# Patient Record
Sex: Male | Born: 1985 | Race: Black or African American | Hispanic: No | Marital: Single | Smoking: Never smoker
Health system: Southern US, Community
[De-identification: ages and names within clinical notes are randomized; demographics above are authoritative.]

## PROBLEM LIST (undated history)

## (undated) DIAGNOSIS — R1310 Dysphagia, unspecified: Secondary | ICD-10-CM

## (undated) DIAGNOSIS — J189 Pneumonia, unspecified organism: Secondary | ICD-10-CM

## (undated) DIAGNOSIS — A072 Cryptosporidiosis: Secondary | ICD-10-CM

## (undated) DIAGNOSIS — D649 Anemia, unspecified: Secondary | ICD-10-CM

## (undated) DIAGNOSIS — J869 Pyothorax without fistula: Secondary | ICD-10-CM

## (undated) DIAGNOSIS — M79661 Pain in right lower leg: Secondary | ICD-10-CM

## (undated) DIAGNOSIS — I3139 Other pericardial effusion (noninflammatory): Secondary | ICD-10-CM

## (undated) DIAGNOSIS — R627 Adult failure to thrive: Secondary | ICD-10-CM

## (undated) DIAGNOSIS — A539 Syphilis, unspecified: Secondary | ICD-10-CM

## (undated) DIAGNOSIS — I313 Pericardial effusion (noninflammatory): Secondary | ICD-10-CM

## (undated) DIAGNOSIS — K922 Gastrointestinal hemorrhage, unspecified: Secondary | ICD-10-CM

## (undated) DIAGNOSIS — Z9289 Personal history of other medical treatment: Secondary | ICD-10-CM

## (undated) DIAGNOSIS — K5190 Ulcerative colitis, unspecified, without complications: Secondary | ICD-10-CM

## (undated) DIAGNOSIS — B20 Human immunodeficiency virus [HIV] disease: Secondary | ICD-10-CM

## (undated) DIAGNOSIS — B9562 Methicillin resistant Staphylococcus aureus infection as the cause of diseases classified elsewhere: Secondary | ICD-10-CM

## (undated) DIAGNOSIS — R7881 Bacteremia: Secondary | ICD-10-CM

## (undated) DIAGNOSIS — Z21 Asymptomatic human immunodeficiency virus [HIV] infection status: Secondary | ICD-10-CM

## (undated) DIAGNOSIS — A0811 Acute gastroenteropathy due to Norwalk agent: Secondary | ICD-10-CM

## (undated) HISTORY — DX: Pain in right lower leg: M79.661

## (undated) HISTORY — DX: Adult failure to thrive: R62.7

## (undated) HISTORY — PX: THORACENTESIS: SHX235

## (undated) HISTORY — DX: Dysphagia, unspecified: R13.10

---
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
## 2008-04-23 DIAGNOSIS — Z9289 Personal history of other medical treatment: Secondary | ICD-10-CM

## 2008-04-23 HISTORY — DX: Personal history of other medical treatment: Z92.89

## 2008-04-23 HISTORY — PX: COLONOSCOPY: SHX174

## 2008-09-08 ENCOUNTER — Encounter: Payer: Self-pay | Admitting: Gastroenterology

## 2008-09-09 ENCOUNTER — Ambulatory Visit: Payer: Self-pay | Admitting: Internal Medicine

## 2008-09-09 DIAGNOSIS — K921 Melena: Secondary | ICD-10-CM

## 2008-09-09 DIAGNOSIS — R198 Other specified symptoms and signs involving the digestive system and abdomen: Secondary | ICD-10-CM

## 2008-09-09 DIAGNOSIS — R109 Unspecified abdominal pain: Secondary | ICD-10-CM

## 2008-09-16 ENCOUNTER — Encounter: Payer: Self-pay | Admitting: Internal Medicine

## 2008-09-16 ENCOUNTER — Ambulatory Visit: Payer: Self-pay | Admitting: Internal Medicine

## 2008-09-16 ENCOUNTER — Ambulatory Visit (HOSPITAL_COMMUNITY): Admission: RE | Admit: 2008-09-16 | Discharge: 2008-09-16 | Payer: Self-pay | Admitting: Internal Medicine

## 2008-09-17 ENCOUNTER — Encounter (INDEPENDENT_AMBULATORY_CARE_PROVIDER_SITE_OTHER): Payer: Self-pay

## 2008-11-18 ENCOUNTER — Emergency Department (HOSPITAL_COMMUNITY): Admission: EM | Admit: 2008-11-18 | Discharge: 2008-11-18 | Payer: Self-pay | Admitting: Emergency Medicine

## 2008-11-25 ENCOUNTER — Ambulatory Visit: Payer: Self-pay | Admitting: Internal Medicine

## 2008-11-25 DIAGNOSIS — K5190 Ulcerative colitis, unspecified, without complications: Secondary | ICD-10-CM | POA: Insufficient documentation

## 2008-11-28 ENCOUNTER — Inpatient Hospital Stay (HOSPITAL_COMMUNITY): Admission: EM | Admit: 2008-11-28 | Discharge: 2008-12-10 | Payer: Self-pay | Admitting: Emergency Medicine

## 2008-11-29 ENCOUNTER — Ambulatory Visit: Payer: Self-pay | Admitting: Pulmonary Disease

## 2008-11-29 ENCOUNTER — Ambulatory Visit: Payer: Self-pay | Admitting: Thoracic Surgery

## 2008-11-30 ENCOUNTER — Encounter (INDEPENDENT_AMBULATORY_CARE_PROVIDER_SITE_OTHER): Payer: Self-pay | Admitting: Internal Medicine

## 2008-12-01 ENCOUNTER — Ambulatory Visit: Payer: Self-pay | Admitting: Gastroenterology

## 2008-12-02 ENCOUNTER — Ambulatory Visit: Payer: Self-pay | Admitting: Internal Medicine

## 2008-12-02 ENCOUNTER — Encounter: Payer: Self-pay | Admitting: Thoracic Surgery

## 2008-12-10 ENCOUNTER — Encounter: Payer: Self-pay | Admitting: Internal Medicine

## 2008-12-21 ENCOUNTER — Encounter: Admission: RE | Admit: 2008-12-21 | Discharge: 2008-12-21 | Payer: Self-pay | Admitting: Thoracic Surgery

## 2008-12-21 ENCOUNTER — Ambulatory Visit: Payer: Self-pay | Admitting: Thoracic Surgery

## 2009-01-12 ENCOUNTER — Ambulatory Visit: Payer: Self-pay | Admitting: Thoracic Surgery

## 2009-01-12 ENCOUNTER — Encounter: Admission: RE | Admit: 2009-01-12 | Discharge: 2009-01-12 | Payer: Self-pay | Admitting: Thoracic Surgery

## 2009-03-09 ENCOUNTER — Ambulatory Visit: Payer: Self-pay | Admitting: Thoracic Surgery

## 2009-03-09 ENCOUNTER — Encounter: Admission: RE | Admit: 2009-03-09 | Discharge: 2009-03-09 | Payer: Self-pay | Admitting: Thoracic Surgery

## 2009-05-16 ENCOUNTER — Encounter (INDEPENDENT_AMBULATORY_CARE_PROVIDER_SITE_OTHER): Payer: Self-pay | Admitting: *Deleted

## 2009-06-08 ENCOUNTER — Ambulatory Visit: Payer: Self-pay | Admitting: Thoracic Surgery

## 2009-06-08 ENCOUNTER — Encounter: Admission: RE | Admit: 2009-06-08 | Discharge: 2009-06-08 | Payer: Self-pay | Admitting: Thoracic Surgery

## 2010-04-23 DIAGNOSIS — J189 Pneumonia, unspecified organism: Secondary | ICD-10-CM

## 2010-04-23 HISTORY — DX: Pneumonia, unspecified organism: J18.9

## 2010-05-15 ENCOUNTER — Encounter: Payer: Self-pay | Admitting: Thoracic Surgery

## 2010-05-24 NOTE — Letter (Signed)
Summary: Appointment Reminder  [facility]  [address]   [place], [place] [address]   Phone: [phone]  Fax: [phone]       May 16, 2009   JAMIL ARMWOOD [address] [place], [place]  [address] 03-Jun-1985    Dear Mr. Meiners,  We have been unable to reach you by phone to schedule a follow up   appointment that was recommended for you by Dr. Jena Gauss. It is very   important that we reach you to schedule an appointment. We hope that you  allow [place] to participate in your health care needs. Please contact us at  [phone] at your earliest convenience to schedule your appointment.  Sincerely,    [facility] R. Roetta Sessions, M.D.    Kassie Mends, M.D. Lorenza Burton, FNP-BC    Tana Coast, PA-C Phone: [phone]    Fax: [phone]

## 2010-07-29 LAB — BASIC METABOLIC PANEL
BUN: 2 mg/dL — ABNORMAL LOW (ref 6–23)
BUN: 3 mg/dL — ABNORMAL LOW (ref 6–23)
BUN: 3 mg/dL — ABNORMAL LOW (ref 6–23)
BUN: 4 mg/dL — ABNORMAL LOW (ref 6–23)
BUN: 7 mg/dL (ref 6–23)
CO2: 29 mEq/L (ref 19–32)
Calcium: 8.3 mg/dL — ABNORMAL LOW (ref 8.4–10.5)
Calcium: 8.5 mg/dL (ref 8.4–10.5)
Chloride: 102 mEq/L (ref 96–112)
Chloride: 102 mEq/L (ref 96–112)
Chloride: 94 mEq/L — ABNORMAL LOW (ref 96–112)
Creatinine, Ser: 0.5 mg/dL (ref 0.4–1.5)
Creatinine, Ser: 0.53 mg/dL (ref 0.4–1.5)
Creatinine, Ser: 0.58 mg/dL (ref 0.4–1.5)
GFR calc Af Amer: >60 mL/min (ref >60)
GFR calc Af Amer: >60 mL/min (ref >60)
GFR calc Af Amer: >60 mL/min (ref >60)
GFR calc non Af Amer: >60 mL/min (ref >60)
GFR calc non Af Amer: >60 mL/min (ref >60)
GFR calc non Af Amer: >60 mL/min (ref >60)
GFR calc non Af Amer: >60 mL/min (ref >60)
Glucose, Bld: 131 mg/dL — ABNORMAL HIGH (ref 70–99)
Glucose, Bld: 92 mg/dL (ref 70–99)
Glucose, Bld: 96 mg/dL (ref 70–99)
Glucose, Bld: 90 mg/dL (ref 70–99)
Potassium: 4.3 mEq/L (ref 3.5–5.1)
Potassium: 3.2 mEq/L — ABNORMAL LOW (ref 3.5–5.1)
Sodium: 131 mEq/L — ABNORMAL LOW (ref 135–145)

## 2010-07-29 LAB — CBC
HCT: 27.2 % — ABNORMAL LOW (ref 39.0–52.0)
HCT: 27.6 % — ABNORMAL LOW (ref 39.0–52.0)
HCT: 28.1 % — ABNORMAL LOW (ref 39.0–52.0)
HCT: 22.8 % — ABNORMAL LOW (ref 39.0–52.0)
Hemoglobin: 9.2 g/dL — ABNORMAL LOW (ref 13.0–17.0)
Hemoglobin: 7.6 g/dL — CL (ref 13.0–17.0)
MCHC: 33.9 g/dL (ref 30.0–36.0)
MCHC: 34.3 g/dL (ref 30.0–36.0)
MCHC: 33.3 g/dL (ref 30.0–36.0)
MCV: 83.6 fL (ref 78.0–100.0)
MCV: 89.2 fL (ref 78.0–100.0)
Platelets: 434 10*3/uL — ABNORMAL HIGH (ref 150–400)
Platelets: 468 10*3/uL — ABNORMAL HIGH (ref 150–400)
RBC: 3.21 MIL/uL — ABNORMAL LOW (ref 4.22–5.81)
RBC: 3.25 MIL/uL — ABNORMAL LOW (ref 4.22–5.81)
RDW: 17.9 % — ABNORMAL HIGH (ref 11.5–15.5)
RDW: 19.1 % — ABNORMAL HIGH (ref 11.5–15.5)
WBC: 5.2 10*3/uL (ref 4.0–10.5)
WBC: 5.3 10*3/uL (ref 4.0–10.5)

## 2010-07-29 LAB — ABO/RH: ABO/RH(D): B POS

## 2010-07-29 LAB — BLOOD GAS, ARTERIAL
Acid-Base Excess: 2.1 mmol/L — ABNORMAL HIGH (ref 0.0–2.0)
O2 Content: 2 L/min
pCO2 arterial: 30.7 mmHg — ABNORMAL LOW (ref 35.0–45.0)
pH, Arterial: 7.52 — ABNORMAL HIGH (ref 7.350–7.450)
pO2, Arterial: 82 mmHg (ref 80.0–100.0)

## 2010-07-29 LAB — CULTURE, BLOOD (ROUTINE X 2)

## 2010-07-29 LAB — CROSSMATCH
ABO/RH(D): B POS
Antibody Screen: NEGATIVE

## 2010-07-29 LAB — DIFFERENTIAL
Lymphs Abs: 1.1 10*3/uL (ref 0.7–4.0)
Metamyelocytes Relative: 0 %
Neutrophils Relative %: 92 % — ABNORMAL HIGH (ref 43–77)
Promyelocytes Absolute: 0 %
nRBC: 0 /100 WBC

## 2010-07-29 LAB — MAGNESIUM: Magnesium: 1.8 mg/dL (ref 1.5–2.5)

## 2010-07-30 LAB — CROSSMATCH

## 2010-07-30 LAB — BASIC METABOLIC PANEL
BUN: 2 mg/dL — ABNORMAL LOW (ref 6–23)
BUN: 3 mg/dL — ABNORMAL LOW (ref 6–23)
BUN: 4 mg/dL — ABNORMAL LOW (ref 6–23)
BUN: 4 mg/dL — ABNORMAL LOW (ref 6–23)
BUN: 5 mg/dL — ABNORMAL LOW (ref 6–23)
CO2: 25 mEq/L (ref 19–32)
CO2: 27 mEq/L (ref 19–32)
CO2: 29 mEq/L (ref 19–32)
Calcium: 7.6 mg/dL — ABNORMAL LOW (ref 8.4–10.5)
Calcium: 7.7 mg/dL — ABNORMAL LOW (ref 8.4–10.5)
Calcium: 7.9 mg/dL — ABNORMAL LOW (ref 8.4–10.5)
Chloride: 100 mEq/L (ref 96–112)
Chloride: 96 mEq/L (ref 96–112)
Chloride: 98 mEq/L (ref 96–112)
Creatinine, Ser: 0.6 mg/dL (ref 0.4–1.5)
Creatinine, Ser: 0.63 mg/dL (ref 0.4–1.5)
Creatinine, Ser: 0.64 mg/dL (ref 0.4–1.5)
Creatinine, Ser: 0.74 mg/dL (ref 0.4–1.5)
GFR calc Af Amer: >60 mL/min (ref >60)
GFR calc non Af Amer: >60 mL/min (ref >60)
GFR calc non Af Amer: >60 mL/min (ref >60)
GFR calc non Af Amer: >60 mL/min (ref >60)
GFR calc non Af Amer: >60 mL/min (ref >60)
Glucose, Bld: 107 mg/dL — ABNORMAL HIGH (ref 70–99)
Glucose, Bld: 91 mg/dL (ref 70–99)
Glucose, Bld: 94 mg/dL (ref 70–99)
Potassium: 3.2 mEq/L — ABNORMAL LOW (ref 3.5–5.1)
Potassium: 3.9 mEq/L (ref 3.5–5.1)
Sodium: 130 mEq/L — ABNORMAL LOW (ref 135–145)
Sodium: 136 mEq/L (ref 135–145)

## 2010-07-30 LAB — CBC
HCT: 27.8 % — ABNORMAL LOW (ref 39.0–52.0)
Hemoglobin: 7.9 g/dL — CL (ref 13.0–17.0)
Hemoglobin: 8.6 g/dL — ABNORMAL LOW (ref 13.0–17.0)
Hemoglobin: 9 g/dL — ABNORMAL LOW (ref 13.0–17.0)
Hemoglobin: 9.3 g/dL — ABNORMAL LOW (ref 13.0–17.0)
Hemoglobin: 9.4 g/dL — ABNORMAL LOW (ref 13.0–17.0)
MCHC: 32.5 g/dL (ref 30.0–36.0)
MCHC: 33.2 g/dL (ref 30.0–36.0)
MCV: 76.8 fL — ABNORMAL LOW (ref 78.0–100.0)
MCV: 78.9 fL (ref 78.0–100.0)
MCV: 79.4 fL (ref 78.0–100.0)
MCV: 82.8 fL (ref 78.0–100.0)
Platelets: 319 10*3/uL (ref 150–400)
Platelets: 344 10*3/uL (ref 150–400)
Platelets: 365 10*3/uL (ref 150–400)
Platelets: 385 10*3/uL (ref 150–400)
RBC: 2.54 MIL/uL — ABNORMAL LOW (ref 4.22–5.81)
RBC: 3.34 MIL/uL — ABNORMAL LOW (ref 4.22–5.81)
RBC: 3.36 MIL/uL — ABNORMAL LOW (ref 4.22–5.81)
RBC: 3.52 MIL/uL — ABNORMAL LOW (ref 4.22–5.81)
RDW: 19.4 % — ABNORMAL HIGH (ref 11.5–15.5)
RDW: 20.5 % — ABNORMAL HIGH (ref 11.5–15.5)
RDW: 21 % — ABNORMAL HIGH (ref 11.5–15.5)
WBC: 20.4 10*3/uL — ABNORMAL HIGH (ref 4.0–10.5)
WBC: 5.7 10*3/uL (ref 4.0–10.5)
WBC: 6.4 10*3/uL (ref 4.0–10.5)
WBC: 7.2 10*3/uL (ref 4.0–10.5)

## 2010-07-30 LAB — IRON AND TIBC
Iron: <10 ug/dL — ABNORMAL LOW (ref 42–135)
UIBC: 82 ug/dL

## 2010-07-30 LAB — CULTURE, BLOOD (ROUTINE X 2)
Culture: NO GROWTH
Culture: NO GROWTH

## 2010-07-30 LAB — HEPATIC FUNCTION PANEL
ALT: 12 U/L (ref 0–53)
Alkaline Phosphatase: 124 U/L — ABNORMAL HIGH (ref 39–117)
Indirect Bilirubin: 1.4 mg/dL — ABNORMAL HIGH (ref 0.3–0.9)
Total Bilirubin: 1.9 mg/dL — ABNORMAL HIGH (ref 0.3–1.2)
Total Protein: 7.2 g/dL (ref 6.0–8.3)

## 2010-07-30 LAB — POCT I-STAT 3, ART BLOOD GAS (G3+)
Acid-Base Excess: 3 mmol/L — ABNORMAL HIGH (ref 0.0–2.0)
Bicarbonate: 30 mEq/L — ABNORMAL HIGH (ref 20.0–24.0)
O2 Saturation: 98 %
Patient temperature: 98
Patient temperature: 98.6
pCO2 arterial: 51.6 mmHg — ABNORMAL HIGH (ref 35.0–45.0)
pH, Arterial: 7.372 (ref 7.350–7.450)

## 2010-07-30 LAB — COMPREHENSIVE METABOLIC PANEL
ALT: 12 U/L (ref 0–53)
AST: 35 U/L (ref 0–37)
CO2: 28 mEq/L (ref 19–32)
Chloride: 100 mEq/L (ref 96–112)
Creatinine, Ser: 0.59 mg/dL (ref 0.4–1.5)
GFR calc Af Amer: >60 mL/min (ref >60)
GFR calc non Af Amer: >60 mL/min (ref >60)
Glucose, Bld: 85 mg/dL (ref 70–99)
Sodium: 133 mEq/L — ABNORMAL LOW (ref 135–145)
Total Bilirubin: 0.7 mg/dL (ref 0.3–1.2)

## 2010-07-30 LAB — TRIGLYCERIDES, BODY FLUIDS: Triglycerides, Fluid: 47 mg/dL

## 2010-07-30 LAB — AFB CULTURE WITH SMEAR (NOT AT ARMC): Acid Fast Smear: NONE SEEN

## 2010-07-30 LAB — PREPARE RBC (CROSSMATCH)

## 2010-07-30 LAB — BODY FLUID CULTURE
Culture: NO GROWTH
Gram Stain: NONE SEEN

## 2010-07-30 LAB — APTT: aPTT: 39 seconds — ABNORMAL HIGH (ref 24–37)

## 2010-07-30 LAB — HEMOCCULT GUIAC POC 1CARD (OFFICE): Fecal Occult Bld: POSITIVE

## 2010-07-30 LAB — LEGIONELLA ANTIGEN, URINE: Legionella Antigen, Urine: NEGATIVE

## 2010-07-30 LAB — GLUCOSE, CAPILLARY: Glucose-Capillary: 96 mg/dL (ref 70–99)

## 2010-07-30 LAB — AMYLASE, BODY FLUID: Amylase, Fluid: 47 U/L

## 2010-07-30 LAB — MAGNESIUM: Magnesium: 1.8 mg/dL (ref 1.5–2.5)

## 2010-07-30 LAB — FUNGUS CULTURE W SMEAR: Fungal Smear: NONE SEEN

## 2010-07-30 LAB — PLATELET COUNT: Platelets: 384 10*3/uL (ref 150–400)

## 2010-07-30 LAB — TISSUE CULTURE: Culture: NO GROWTH

## 2010-07-30 LAB — PH, BODY FLUID: pH, Fluid: 8

## 2010-07-30 LAB — BODY FLUID CELL COUNT WITH DIFFERENTIAL
Monocyte-Macrophage-Serous Fluid: 3 % — ABNORMAL LOW (ref 50–90)
Neutrophil Count, Fluid: 94 % — ABNORMAL HIGH (ref 0–25)
Total Nucleated Cell Count, Fluid: 695 cu mm (ref 0–1000)

## 2010-07-30 LAB — LACTATE DEHYDROGENASE, PLEURAL OR PERITONEAL FLUID: LD, Fluid: 589 U/L — ABNORMAL HIGH (ref 3–23)

## 2010-07-30 LAB — HEPATITIS PANEL, ACUTE
Hep A IgM: NEGATIVE
Hepatitis B Surface Ag: NEGATIVE

## 2010-07-30 LAB — VITAMIN B12: Vitamin B-12: 475 pg/mL (ref 211–911)

## 2010-07-30 LAB — RETICULOCYTES
RBC.: 3.62 MIL/uL — ABNORMAL LOW (ref 4.22–5.81)
Retic Ct Pct: 0.6 % (ref 0.4–3.1)

## 2010-07-30 LAB — PROTIME-INR
INR: 1.4 (ref 0.00–1.49)
Prothrombin Time: 15 seconds (ref 11.6–15.2)

## 2010-07-30 LAB — T-HELPER CELLS (CD4) COUNT (NOT AT ARMC): CD4 % Helper T Cell: 27 % — ABNORMAL LOW (ref 33–55)

## 2010-07-30 LAB — HIV-1 RNA QUANT-NO REFLEX-BLD: HIV 1 RNA Quant: 111000 copies/mL — ABNORMAL HIGH (ref <48)

## 2010-08-01 LAB — STOOL CULTURE

## 2010-08-01 LAB — DIFFERENTIAL
Basophils Absolute: 0 10*3/uL (ref 0.0–0.1)
Basophils Relative: 0 % (ref 0–1)
Monocytes Relative: 8 % (ref 3–12)
Neutro Abs: 4.5 10*3/uL (ref 1.7–7.7)
Neutrophils Relative %: 71 % (ref 43–77)

## 2010-08-01 LAB — BASIC METABOLIC PANEL
CO2: 27 mEq/L (ref 19–32)
Calcium: 8.5 mg/dL (ref 8.4–10.5)
Creatinine, Ser: 0.87 mg/dL (ref 0.4–1.5)
GFR calc Af Amer: >60 mL/min (ref >60)

## 2010-08-01 LAB — CLOSTRIDIUM DIFFICILE EIA: C difficile Toxins A+B, EIA: NEGATIVE

## 2010-08-01 LAB — CBC
MCHC: 34.6 g/dL (ref 30.0–36.0)
Platelets: 325 10*3/uL (ref 150–400)
RBC: 3.29 MIL/uL — ABNORMAL LOW (ref 4.22–5.81)
RDW: 13.9 % (ref 11.5–15.5)

## 2010-08-01 LAB — OVA AND PARASITE EXAMINATION

## 2010-08-01 LAB — FECAL LACTOFERRIN, QUANT

## 2010-09-05 NOTE — H&P (Signed)
NAME:  Brian Adkins, Brian Adkins               ACCOUNT NO.:  [id number]   MEDICAL RECORD NO.:  [id number]          PATIENT TYPE:  INP   LOCATION:  A303                          FACILITY:  [facility]   PHYSICIAN:  Tesfaye D. Felecia Shelling, MD   DATE OF BIRTH:  10-18-1985   DATE OF ADMISSION:  11/28/2008  DATE OF DISCHARGE:  08/09/2010LH                              HISTORY & PHYSICAL   CHIEF COMPLAINT:  Generalized weakness and neck pain.   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  The  patient has a history of previous rectal bleed, possibly inflammatory  bowel disease.  He was evaluated by GI in May 2010.  The patient  developed neck pain recently and it has been getting worse.  Yesterday,  he feels very weak and decided to go to emergency room.  The patient was  seen in emergency room and his chest x-ray showed bilateral pneumonia.  He has also very low hemoglobin and hematocrit.  His D-dimer was  elevated.  The patient was admitted by on-call physician as a possible  case of pneumonia and a GI bleed.  The patient was started on IV  antibiotics and blood transfusion.  The patient continued to have  generalized weakness and low-grade fever.   REVIEW OF SYSTEMS:  The patient has generalized weakness, headache, and  loss of appetite.  No cough, chest pain, nausea, vomiting, abdominal  pain, dysuria, urgency or frequency of urination.   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   MEDICATION:  Lialda oral 1 tablet 4 times a day.   SOCIAL HISTORY:  The patient is single.  He is a Archivist.  No  history of alcohol, tobacco or substance abuse.   PHYSICAL EXAMINATION:  GENERAL:  The patient is acutely sick looking.  VITAL SIGNS:  Blood pressure 144/90, pulse 124, respiratory rate 18,  temperature 101 degrees Fahrenheit.  HEENT:  Pupils are equal and  reactive.  NECK:  His neck is rigid and painful on movement.  CHEST:  Poor air entry, bilateral rhonchi and crackles at base.  CARDIOVASCULAR:  First and second heart sound heard.  No murmur, no  gallop.  ABDOMEN:  Soft and lax.  Bowel sound is positive.  No mass or  organomegaly.  EXTREMITIES:  No leg edema.   LABORATORY FINDINGS:  On admission; ABG on 2 L, pH 7.520, pCO2 30, pO2  80, saturation 96.  BMP; sodium 131, potassium 3.2, chloride 94, carbon  dioxide 25, glucose 90, BUN 7, creatinine 0.9.  CBC; WBC 22, hemoglobin  7.6, hematocrit 22.8, and platelets 468.  D-dimer is 5.9.   ASSESSMENT:  This is a 25 year old male patient with history of previous  rectal bleed, who came with generalized weakness and neck pain.  The  patient has a low grade fever and abnormal chest x-ray, possibly  pneumonia.  The fact that he has neck pain, fever and toxic appearance,  the possibility of meningitis has to be  considered.  His anemia could be  from chronic gastrointestinal blood loss with a history of previous  rectal bleeding.   PLAN:  I have reviewed his admission orders and evaluated the patient.  I have contacted [facility] and discussed with  attending about his condition.  Because of his complex medical  presentation and a possible bilateral pneumonia and possibly also  meningitis and with chronic significant GI bleed, I will immediately  transfer to [facility].  The patient has  currently accepted and [facility] is on the way to transfer the patient.      Tesfaye D. Felecia Shelling, MD  Electronically Signed     TDF/MEDQ  D:  11/29/2008  T:  11/29/2008  Job:  [id number]

## 2010-09-05 NOTE — Op Note (Signed)
Brian Adkins, Brian Adkins               ACCOUNT NO.:  [id number]   MEDICAL RECORD NO.:  [id number]          PATIENT TYPE:  INP   LOCATION:  2106                         FACILITY:  [facility]   PHYSICIAN:  Ines Bloomer, M.D. DATE OF BIRTH:  December 04, 1985   DATE OF PROCEDURE:  12/02/2008  DATE OF DISCHARGE:                               OPERATIVE REPORT   PREOPERATIVE DIAGNOSIS:  Empyema, right chest.   POSTOPERATIVE DIAGNOSIS:  Empyema, right chest.   OPERATION PERFORMED:  Right video-assisted thoracoscopic surgery, mini  thoracotomy, drainage of empyema with decortication.   SURGEON:  Ines Bloomer, MD   FIRST ASSISTANT:  Gershon Crane, PA-C   ANESTHESIA:  General anesthesia.   DESCRIPTION OF PROCEDURE:  After percutaneous insertion of all  monitoring lines, the patient underwent general anesthesia.  He was  prepped and draped in the usual sterile manner.  He was turned in the  right lateral thoracotomy position.  Two trocar sites were made in the  anterior and posterior axillary line at the seventh intercostal space  and where the previous chest tube was inserted, we converted that into  an incision over the sixth intercostal space, proximal stab wound 8-cm  incision.  After that, a 0-degree scope was inserted and the patient had  an obvious empyema and we used the scope and Kaiser ring forceps to free  up the lung medially, laterally, and superiorly and then partially off  the diaphragm.  We then converted to the small incision and put a TPA in  the sixth intercostal space after partially dividing the latissimus and  splitting the serratus.  We then freed the lung up off the diaphragm and  freed all the exudate off the chest wall and freed the middle lobe and  the anterior segment of the upper lobe off the mediastinum.  There was  no evidence that the pericardium which services thought that there may  be effusion.  It was inflamed but you could see that it was not  distended and was  beating easily.  We then freed up all the exudate off  the diaphragm and posteriorly from the costophrenic angle.  We then did  a decortication of the lower lobe, middle lobe, and partial lower  segment of the upper lobe using forceps to strip off the exudate as well  as curettes and Kitners.  After this had been done, three chest tubes  were placed, two through the trocar sites and tied in place with 0-silk  and one in between was a right-angle 28 chest tube.  It was placed in  the costophrenic angle.  The Marcaine block was done in the usual  fashion.  A single On-Q was placed in the usual fashion.  The chest was  closed with three pericostals, #1 Vicryl in the muscle layer, 2-0 Vicryl  in the subcutaneous tissue, and Dermabond for the skin.  The patient  returned to the recovery room in stable condition.      Ines Bloomer, M.D.  Electronically Signed    DPB/MEDQ  D:  12/02/2008  T:  12/03/2008  Job:  [id number] 

## 2010-09-05 NOTE — Letter (Signed)
December 21, 2008   Tesfaye D. Felecia Shelling, MD  [address]  [place], [place] [address]   Re:  [name], Brian Adkins               DOB:  08/29/1985   Dear Dr. Felecia Shelling:   I saw the patient back today after we had operated on him for an  empyema.  He looks good today.  He actually had cancelled 2  appointments, and this is his first postoperative visit, so we removed  his sutures which had been in for almost 3-4 weeks.  His incision is  well-healed.  Chest x-ray shows increasing resolution changes in his  empyema, and this is gradually resolving, but it still has a way to go.  There is a lot of inflammatory reaction there.  I plan to see him back  again in 3 weeks with another chest x-ray.  His blood pressure is  130/85, pulse 100, respirations 18, saturation at 100%.  Lungs were  clear to auscultation and percussion.   Sincerely,   Ines Bloomer, M.D.  Electronically Signed   DPB/MEDQ  D:  12/21/2008  T:  12/22/2008  Job:  [id number]

## 2010-09-05 NOTE — Assessment & Plan Note (Signed)
OFFICE VISIT   Brian Adkins, Brian Adkins  DOB:  1986-04-20                                        January 12, 2009  CHART #:  [id number]   The patient returns today.  He still has a lot of reaction in his right  lower lobe base where he had his empyema, but it has improved.  There is  less air and fluid and hopefully this will gradually improve over the  next several months.  He is completely asymptomatic.  His blood pressure  is 119/75, pulse 80, respirations 18, sats were 100%.  I told him I  would see him back again in 2 months with a chest x-ray and that he  could start running again.   Ines Bloomer, M.D.  Electronically Signed   DPB/MEDQ  D:  01/12/2009  T:  01/13/2009  Job:  [id number]

## 2010-09-05 NOTE — Op Note (Signed)
NAME:  Brian Adkins, Brian Adkins               ACCOUNT NO.:  [id number]   MEDICAL RECORD NO.:  [id number]          PATIENT TYPE:  AMB   LOCATION:  DAY                           FACILITY:  [facility]   PHYSICIAN:  R. Roetta Sessions, M.D. DATE OF BIRTH:  02/03/1986   DATE OF PROCEDURE:  09/16/2008  DATE OF DISCHARGE:                               OPERATIVE REPORT   INDICATIONS FOR PROCEDURE:  A 25 year old gentleman with bloody  intermittent diarrhea, essentially painless, over 4 months.  Colonoscopy  is now being done.  Risks, benefits, alternatives and limitations have  been reviewed, questions answered.  Please see the documentation in the  medical record.   PROCEDURE NOTE:  The O2 saturation, blood pressure, pulse and  respirations were monitored throughout the entire procedure.   CONSCIOUS SEDATION:  1. Versed 5 mg IV.  2. Demerol 100 mg IV in divided doses.   INSTRUMENT:  Pentax video chip system.   FINDINGS:  Digital rectal exam revealed no abnormalities.   ENDOSCOPIC FINDINGS:  The prep was poor and barely doable, there was  liquid viscous stool throughout the colon.  The colonic mucosa was  surveyed from the rectosigmoid junction, through the left transverse,  right colon to the appendiceal orifice, ileocecal valve and cecum.  These structures were seen and photographed for the record.  From this  level, the scope was slowly and cautiously withdrawn.  All previously  mentioned mucosal surfaces were again seen.  In spite of copious  irrigation and suctioning, all of the colonic mucosa could not be seen  in good detail; however, the colonic mucosa was seen from the cecum all  the way back down to the junction between sigmoid and descending  appeared normal.  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  A stool sample was previously suctioned out for the biology  lab.  Biopsies of what appeared to be normal descending and then  abnormal sigmoid and rectal biopsies were taken separately.  The patient  tolerated the procedure well.  Cecal withdrawal time 8 minutes.   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  Poor prep compromised the exam and a small  lesion upstream may have been obscured by the poor prep.   RECOMMENDATIONS:  Follow-up on stool and biopsies.  Will do a repeat CBC  and baseline BMET today.  I anticipate initiating therapy in the very  near future for idiopathic inflammatory bowel disease (i.e., ulcerative  colitis - left-sided).      Jonathon Bellows, M.D.  Electronically Signed     RMR/MEDQ  D:  09/16/2008  T:  09/16/2008  Job:  [id number]   cc:   Tesfaye D. Felecia Shelling, MD  Fax: [phone]

## 2010-09-05 NOTE — Discharge Summary (Signed)
NAMETOWNES, FUHS               ACCOUNT NO.:  [id number]   MEDICAL RECORD NO.:  [id number]          PATIENT TYPE:  INP   LOCATION:  5122                         FACILITY:  [facility]   PHYSICIAN:  Michelene Gardener, MD    DATE OF BIRTH:  11/23/1985   DATE OF ADMISSION:  11/29/2008  DATE OF DISCHARGE:  12/10/2008                               DISCHARGE SUMMARY   Initial discharge summary was done by Dr. Hillery Aldo on December 07, 2008.   DISCHARGE DIAGNOSES:  1. Right lower lobe pneumonia.  2. Right lower lobe empyema status post video-assisted thoracic      surgery on December 02, 2008.  3. History of ulcerative colitis.  4. A lower gastrointestinal bleed.  5. 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  6. Methicillin-sensitive Staphylococcus aureus bacteremia.  7. Human immunodeficiency virus positive.  8. Hyponatremia that resolved.  9. Hypokalemia that resolved.  10.Mild to moderate pericardial effusion.   DISCHARGE MEDICATIONS:  1. Ancef 2 g IV q.8 hours up to December 29, 2008.  2. Mesalamine 400 mg p.o. 8 hours.   For details about consultation, procedures, and radiology studies,  please refer to previously dictated discharge summary by Dr. Hillery Aldo on August 17.  Since that time the patient had the following  updates:   1. X-ray on August 17 showed no change in the aeration of the left      hemothorax.  2. Repeat x-ray on August 18 showed stable findings.  3. Repeat x-ray on August 19 showed right chest tube removed with      small right pneumothorax laterally which is most likely loculated.  4. Repeat x-ray on August 19 showed PICC line.  Otherwise, same      findings from previous x-ray.  5. Repeat x-ray on August 20 showed stable loculated right lateral      hydropneumothorax with stable right basilar atelectasis and/or      pneumonia and no new abnormalities.   FOLLOW UP:  1. Primary doctor within a week.  2. Infectious disease.   Infectious Disease Clinic in 1-2 weeks.   COURSE OF HOSPITALIZATION:  For details about this current  hospitalization, please refer to previously dictated discharge summary  on August 17 by Dr. Hillery Aldo.  Since that time the patient  continued to do very well.  The patient was transferred out of the  intensive care unit.  His antibiotics were continued.  He has been  followed by daily x-rays.  His chest tube was removed on August 19, and  he did very well following the removal of his chest tube.  Repeat x-ray  showed a small persistent hydropneumothorax, but as a matter of fact  there is no more drainage coming from the chest tube.  The patient was  cleared by vascular surgery and by infectious disease to be discharged.  Plan is to continue Ancef 2 g IV q.8 hours up to September 8.  Arrangements were done to have IV therapy at home.  The patient also  will follow with Infectious Disease Clinic for  his antibiotics in  addition to his HIV.  Otherwise, the patient is stable and will be  discharged in satisfactory condition.   Total assessment time is 40 minutes.      Michelene Gardener, MD  Electronically Signed     NAE/MEDQ  D:  12/10/2008  T:  12/10/2008  Job:  [id number]

## 2010-09-05 NOTE — Consult Note (Signed)
Brian Adkins, Brian Adkins               ACCOUNT NO.:  [id number]   MEDICAL RECORD NO.:  [id number]          PATIENT TYPE:  INP   LOCATION:  2106                         FACILITY:  [facility]   PHYSICIAN:  Ines Bloomer, M.D. DATE OF BIRTH:  07-May-1985   DATE OF CONSULTATION:  DATE OF DISCHARGE:                                 CONSULTATION   CHIEF COMPLAINT:  Right chest pain, fever, and shortness of breath.   HISTORY OF PRESENT ILLNESS:  This 25 year old African American male who  had a 3-4 day history of chills, fever, right chest pain, and  progressive shortness of breath.  Seen in the emergency room and  admitted with a chest x-ray that showed a right lower lobe loculated  pneumonia with a loculations and probable empyema.  His white count was  22,000, his hemoglobin was 7.6 with a hematocrit of 22.8.  He has a  history of ulcerative colitis and had previous rectal bleeding.  His CT  scan is pending.  He has had no fever, chills, or excessive sputum and  also noted to be hypokalemic with potassium of 3.2.  We were asked to  see him because for possible empyema and started on antibiotics and  admitted to the ICU.   His medications include Ventolin, Atrovent, Rocephin, Zithromax, and  Tylenol.  He was originally seen at [facility] and has been transferred  to [facility].   He has no allergies.   PAST MEDICAL HISTORY:  Significant for rectal bleeding, so he has  ulcerative colitis.   SOCIAL HISTORY:  Occasional drink.  Does not smoke.  No drug abuse.  Single.   REVIEW OF SYSTEMS:  CARDIAC:  No angina or atrial fibrillation.  PULMONARY:  No hemoptysis or fever.  GI:  See history of present  illness.  GU:  No dysuria or frequent urination.  VASCULAR:  No  claudication, DVT, or TIAs.  NEUROLOGIC:  No dizziness, headaches,  blackouts, or seizures.  MUSCULOSKELETAL:  No joint pains.  ENT:  No  changes in his eye sight or hearing.  HEMATOLOGIC:  No problems with  bleeding, clotting  disorders, or anemia.   PHYSICAL EXAMINATION:  VITAL SIGNS:  Blood pressure is 110/80, pulse  100, respirations 20, and saturations 92%.  EYES, EARS, NOSE AND THROAT:  Unremarkable.  NECK:  Supple without thyromegaly.  CHEST:  Clear to auscultation and percussion.  He has marked decreased  breath sounds on the right side.  HEART:  Regular sinus rhythm.  ABDOMEN:  Soft.  No hepatosplenomegaly.  EXTREMITIES:  Pulses are 2+.  NEUROLOGIC:  He is oriented x3.   IMPRESSION:  1. Right lower lobe pneumonia and probable empyema.  2. History of ulcerative colitis and has been treated with oral Lialda      4 times a day.   PLAN:  CT scan.      Ines Bloomer, M.D.  Electronically Signed     DPB/MEDQ  D:  11/29/2008  T:  11/30/2008  Job:  [id number]

## 2010-09-05 NOTE — Assessment & Plan Note (Signed)
OFFICE VISIT   Brian Adkins, Brian Adkins  DOB:  1985-08-28                                        June 08, 2009  CHART #:  [id number]   The patient came for followup today and this is his last follow up for  his empyema that we did of his left chest that we did in August.  Chest  x-ray is back to normal.  His blood pressure was 113/70, pulse 66,  respirations 16, sats were 98%.  I will release him back to full  activity, and we will see him again if he has any further pulmonary  problems.   Ines Bloomer, M.D.  Electronically Signed   DPB/MEDQ  D:  06/08/2009  T:  06/09/2009  Job:  [id number]

## 2010-09-05 NOTE — Assessment & Plan Note (Signed)
OFFICE VISIT   Brian Adkins, Brian Adkins  DOB:  May 19, 1985                                        March 09, 2009  CHART #:  [id number]   His blood pressure is 115/67, pulse 68, respirations 18, sats were 99%.  He is doing well overall.  No new problem.  We will see him back again.  His chest x-ray showed marked improvement in parenchymal process in the  right side, so he is getting over his empyema.  We will see him back  again in 3 months for a final check, but hopefully his pleural process  continues to improve.   Ines Bloomer, M.D.  Electronically Signed   DPB/MEDQ  D:  03/09/2009  T:  03/10/2009  Job:  [id number]

## 2010-09-05 NOTE — Group Therapy Note (Signed)
Brian Adkins, Brian Adkins NO.:  [id number]   MEDICAL RECORD NO.:  [id number]          PATIENT TYPE:  INP   LOCATION:  2608                         FACILITY:  [facility]   PHYSICIAN:  Hillery Aldo, M.D.   DATE OF BIRTH:  04/03/1986                                 PROGRESS NOTE   DATE OF DISCHARGE:  Pending.   PRIMARY CARE PHYSICIAN:  Tesfaye D. Felecia Shelling, MD.   DISCHARGE DIAGNOSES:  1. Right lower lobe pneumonia/empyema, status post video-assisted      thoracic surgery on December 02, 2008.  2. History of ulcerative colitis.  3. Lower gastrointestinal bleed.  4. 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  5. Methicillin-sensitive Staphylococcus aureus bacteremia.  6. Human immunodeficiency virus positive.  7. Hyponatremia.  8. Hypokalemia.  9. Small-to-moderate pericardial effusion.   DISCHARGE MEDICATIONS:  Will be dictated at the time of actual discharge.   CONSULTATIONS:  1. Dr. Edwyna Shell of CVTS.  2. Dr. Orvan Falconer of infectious diseases.  3. Dr. Coralyn Helling of pulmonology.  4. Dr. Christella Hartigan of gastroenterology.   BRIEF HISTORY:  The patient is a 25 year old male who came to the emergency department  with a chief complaint of generalized weakness and neck pain.  Upon  initial evaluation, chest radiography showed bilateral pneumonia and  routine labs showed profound anemia with a hemoglobin of 7.6.  Because  of the complexity of his presenting symptoms, the patient was  transferred to [facility] under the service of critical care.  For the full details, please see the dictated report done by Dr. Felecia Shelling.   PROCEDURES AND DIAGNOSTIC STUDIES:  1. Chest x-rays done on August 11/2008; November 29, 2008; November 30, 2008; December 01, 2008; December 02, 2008;  December 03, 2008; December 04, 2008; December 05, 2008; December 06, 2008, and December 07, 2008:      Original chest radiograph showed opacities at the lung bases, right      greater than  left, most likely representative of loculated fluid      and possible pneumonia.  Serial x-rays over time were stable.  2. CT scan of the chest on November 29, 2008 showed bilateral infiltrates      and considerable volume loss noted within the right lower lobe.      Multiple areas of ill-defined nodularity scattered throughout the      lungs bilaterally.  Septic emboli, ill-defined metastases,      opportunistic infection, or connective tissue disease could all be      in the differential.  Pericardial effusion.  3. Two-dimensional echocardiogram on November 30, 2008 showed normal      systolic function with an estimated ejection fraction of 60-65%.      Wall motion was normal.  No regional wall motion abnormalities.      Tissue Doppler parameters were normal.  There was mild mitral valve      regurgitation.  There was a small-to-moderate free-flowing      pericardial effusion circumferential to the heart.  No evidence for  constrictive physiology.  No vegetations.   DISCHARGE LABORATORY VALUES:  Will be dictated at the time of actual discharge.   HOSPITAL COURSE BY PROBLEM:  Problem #1.  Right lower lobe pneumonia/empyema:  The patient was  admitted and a chest tube was placed on November 29, 2008.  CVTS was  subsequently consulted and the patient underwent a VATS procedure on  December 02, 2008.  He was initially put on broad-spectrum antibiotics  consisting of Rocephin, azithromycin, and vancomycin, which were  continued from November 29, 2008 through December 02, 2008.  After  consultation with infectious diseases, the patient's antibiotics were  narrowed to Ancef, which he has remained on since December 02, 2008.  Recommendations are to complete 4 weeks of treatment with Ancef.  The  patient is currently stable.  Problem #2.  History of ulcerative colitis with recurrent GI bleeding:  The patient was seen and evaluated by Dr. Christella Hartigan of gastroenterology.  He was maintained on Lialda in the  outpatient environment.  Mesalamine  was started at Dr. Christella Hartigan' direction.  It was hoped that the patient's  symptoms could be controlled without resorting to steroids given his  significant infection.  At this time, the patient's hemoglobin is stable  and his symptoms are minimal.  Problem #3.  Mild-to-moderate pericardial effusion:  There was some  question of cardiomyopathy and possible endocarditis given the patient's  presenting symptoms.  A two-dimensional echocardiogram was done on  November 30, 2008, with the findings as noted above.  No further  endocarditis workup is planned at this time.  Problem #4.  Acute on chronic blood loss anemia secondary to GI  bleeding:  The patient has received a total of 6 units of packed red  blood cells.  An anemia panel was done, which showed iron deficiency.  The patient will be put on iron supplementation.  Problem #5.  Methicillin Staphylococcus aureus bacteremia:  The patient  had 2/2 blood cultures positive on November 28, 2008.  He remains on  appropriate therapy.  Problem #5.  HIV:  The patient is HIV positive.  Viral load is currently  111,000 and a CD-4 count was checked and found to be 310.  He has not  been under any treatment for his HIV status.  Problem #6.  Hyponatremia:  This resolved.  Problem #7.  Hypokalemia:  The patient is receiving potassium  supplementation regularly.  We continue to monitor his electrolytes  closely.  Problem #8.  Small-moderate pericardial effusion: There has been no  evidence of tamponade physiology.   DISPOSITION:  The patient will be discharged home when medically stable.   A discharge summary addendum will be dictated at the time of actual  discharge.      Hillery Aldo, M.D.  Electronically Signed     CR/MEDQ  D:  12/07/2008  T:  12/07/2008  Job:  [id number]   cc:   Tesfaye D. Felecia Shelling, MD  Fax: [phone]

## 2010-12-27 ENCOUNTER — Ambulatory Visit: Payer: Self-pay

## 2013-11-13 ENCOUNTER — Encounter (HOSPITAL_COMMUNITY): Payer: Self-pay | Admitting: Emergency Medicine

## 2013-11-13 ENCOUNTER — Emergency Department (HOSPITAL_COMMUNITY)
Admission: EM | Admit: 2013-11-13 | Discharge: 2013-11-13 | Disposition: A | Discharge status: Home / Self Care | Payer: Self-pay | Attending: Emergency Medicine | Admitting: Emergency Medicine

## 2013-11-13 DIAGNOSIS — L02611 Cutaneous abscess of right foot: Secondary | ICD-10-CM

## 2013-11-13 DIAGNOSIS — L03119 Cellulitis of unspecified part of limb: Principal | ICD-10-CM

## 2013-11-13 DIAGNOSIS — F17200 Nicotine dependence, unspecified, uncomplicated: Secondary | ICD-10-CM | POA: Insufficient documentation

## 2013-11-13 DIAGNOSIS — Z79899 Other long term (current) drug therapy: Secondary | ICD-10-CM | POA: Insufficient documentation

## 2013-11-13 DIAGNOSIS — R000 Tachycardia, unspecified: Secondary | ICD-10-CM | POA: Insufficient documentation

## 2013-11-13 DIAGNOSIS — M25579 Pain in unspecified ankle and joints of unspecified foot: Secondary | ICD-10-CM | POA: Insufficient documentation

## 2013-11-13 DIAGNOSIS — L02619 Cutaneous abscess of unspecified foot: Secondary | ICD-10-CM | POA: Insufficient documentation

## 2013-11-13 DIAGNOSIS — Z792 Long term (current) use of antibiotics: Secondary | ICD-10-CM | POA: Insufficient documentation

## 2013-11-13 DIAGNOSIS — L03115 Cellulitis of right lower limb: Secondary | ICD-10-CM

## 2013-11-13 MED ORDER — HYDROCODONE-ACETAMINOPHEN 5-325 MG PO TABS: 1.0000 | ORAL_TABLET | Freq: Once | ORAL | Status: DC

## 2013-11-13 MED ORDER — LIDOCAINE HCL (PF) 2 % IJ SOLN
INTRAMUSCULAR | Status: AC
  Administered 2013-11-13: 05:00:00
  Filled 2013-11-13: qty 10

## 2013-11-13 MED ORDER — SULFAMETHOXAZOLE-TMP DS 800-160 MG PO TABS
1.0000 | ORAL_TABLET | Freq: Once | ORAL | Status: AC
  Administered 2013-11-13: 1 via ORAL
  Filled 2013-11-13: qty 1

## 2013-11-13 MED ORDER — IBUPROFEN 400 MG PO TABS
600.0000 mg | ORAL_TABLET | Freq: Once | ORAL | Status: AC
  Administered 2013-11-13: 600 mg via ORAL
  Filled 2013-11-13: qty 2

## 2013-11-13 MED ORDER — HYDROCODONE-ACETAMINOPHEN 5-325 MG PO TABS
1.0000 | ORAL_TABLET | Freq: Once | ORAL | Status: AC
  Administered 2013-11-13: 2 via ORAL
  Filled 2013-11-13: qty 2

## 2013-11-13 MED ORDER — SULFAMETHOXAZOLE-TRIMETHOPRIM 800-160 MG PO TABS: 1.0000 | ORAL_TABLET | Freq: Two times a day (BID) | ORAL | Status: DC

## 2013-11-13 MED ORDER — HYDROCODONE-ACETAMINOPHEN 5-325 MG PO TABS: 1.0000 | ORAL_TABLET | ORAL | Status: DC | PRN

## 2013-11-13 MED ORDER — CEPHALEXIN 500 MG PO CAPS
500.0000 mg | ORAL_CAPSULE | Freq: Once | ORAL | Status: AC
  Administered 2013-11-13: 500 mg via ORAL
  Filled 2013-11-13: qty 1

## 2013-11-13 MED ORDER — CEPHALEXIN 500 MG PO CAPS: 500.0000 mg | ORAL_CAPSULE | Freq: Three times a day (TID) | ORAL | Status: DC

## 2013-11-13 NOTE — ED Notes (Signed)
Pt. Reports noticing a bump on the top of his right foot one week ago. Pt. Reports increased pain and swelling since that time. No known injury. Right foot is hot to the touch.

## 2013-11-13 NOTE — Discharge Instructions (Signed)
If you were given medicines take as directed.  If you are on coumadin or contraceptives realize their levels and effectiveness is altered by many different medicines.  If you have any reaction (rash, tongues swelling, other) to the medicines stop taking and see a physician.   Please follow up as directed and return to the ER or see a physician for new or worsening symptoms.  Thank you. Filed Vitals:   11/13/13 0506  BP: 139/87  Pulse: 110  Temp: 98.5 F (36.9 C)  TempSrc: Oral  Resp: 16  Height: 5\' 2"  (1.575 m)  Weight: 130 lb (58.968 kg)  SpO2: 100%    Abscess An abscess (boil or furuncle) is an infected area on or under the skin. This area is filled with yellowish-white fluid (pus) and other material (debris). HOME CARE   Only take medicines as told by your doctor.  If you were given antibiotic medicine, take it as directed. Finish the medicine even if you start to feel better.  If gauze is used, follow your doctor's directions for changing the gauze.  To avoid spreading the infection:  Keep your abscess covered with a bandage.  Wash your hands well.  Do not share personal care items, towels, or whirlpools with others.  Avoid skin contact with others.  Keep your skin and clothes clean around the abscess.  Keep all doctor visits as told. GET HELP RIGHT AWAY IF:   You have more pain, puffiness (swelling), or redness in the wound site.  You have more fluid or blood coming from the wound site.  You have muscle aches, chills, or you feel sick.  You have a fever. MAKE SURE YOU:   Understand these instructions.  Will watch your condition.  Will get help right away if you are not doing well or get worse. Document Released: 09/26/2007 Document Revised: 10/09/2011 Document Reviewed: 06/22/2011 [facility] Patient Information 2015 [place] [employer] [place]. This information is not intended to replace advice given to you by your health care provider. Make sure you discuss any  questions you have with your health care provider.

## 2013-11-13 NOTE — ED Notes (Signed)
Pt. Soaking foot in warm water.

## 2013-11-13 NOTE — ED Provider Notes (Signed)
CSN: [id number]     Arrival date & time 11/13/13  0441 History   First MD Initiated Contact with Patient 11/13/13 [phone]     Chief Complaint  Patient presents with  . Foot Pain     (Consider location/radiation/quality/duration/timing/severity/associated sxs/prior Treatment) HPI Comments: 28 year old male with occasional smoking history, ulcers colitis presents with right foot pain and swelling. Patient works for [employer] and stands frequently. Patient denies injuries or history of murmurs or skin infection. Pain and swelling gradually worsened dorsum of the right foot. No other rash on other areas of the body. Mild warmth and tenderness to the area to palpation. Gradually worsening since the weekend. Patient not currently on antibiotics.  Patient is a 28 y.o. male presenting with lower extremity pain. The history is provided by the patient.  Foot Pain    History reviewed. No pertinent past medical history. Past Surgical History  Procedure Laterality Date  . Thoracentesis     No family history on file. History  Substance Use Topics  . Smoking status: Current Some Day Smoker    Types: Cigarettes  . Smokeless tobacco: Not on file  . Alcohol Use: No    Review of Systems  Constitutional: Negative for fever and chills.  Respiratory: Negative for cough.   Musculoskeletal: Negative for arthralgias.  Skin: Positive for rash and wound.  Neurological: Negative for light-headedness.      Allergies  Review of patient's allergies indicates no known allergies.  Home Medications   Prior to Admission medications   Medication Sig Start Date End Date Taking? Authorizing Provider  cephALEXin (KEFLEX) 500 MG capsule Take 1 capsule (500 mg total) by mouth 3 (three) times daily. 11/13/13   Enid SkeensJoshua M Taygen Acklin, MD  HYDROcodone-acetaminophen (NORCO) 5-325 MG per tablet Take 1-2 tablets by mouth every 4 (four) hours as needed. 11/13/13   Enid SkeensJoshua M Dvontae Ruan, MD  sulfamethoxazole-trimethoprim (SEPTRA  DS) 800-160 MG per tablet Take 1 tablet by mouth 2 (two) times daily. 11/13/13   Enid SkeensJoshua M Kham Zuckerman, MD   BP 139/87  Pulse 110  Temp(Src) 98.5 F (36.9 C) (Oral)  Resp 16  Ht 5\' 2"  (1.575 m)  Wt 130 lb (58.968 kg)  BMI 23.77 kg/m2  SpO2 100% Physical Exam  Nursing note and vitals reviewed. Constitutional: He appears well-developed and well-nourished. No distress.  HENT:  Head: Normocephalic and atraumatic.  Neck: Neck supple.  Cardiovascular: Tachycardia present.   Pulmonary/Chest: Effort normal.  Musculoskeletal: He exhibits edema and tenderness.  Neurological: He is alert.  Skin: Skin is warm. Rash noted. There is erythema.  Patient has 3 cm area of fluctuance with surrounding mild erythema and warmth on the right distal dorsum of the foot. No streaking erythema up the leg and no ankle involvement. No crepitus  Psychiatric: He has a normal mood and affect.    ED Course  Procedures (including critical care time) EMERGENCY DEPARTMENT US SOFT TISSUE INTERPRETATION "Study: Limited Soft Tissue Ultrasound"  INDICATIONS: Pain and Soft tissue infection Multiple views of the body part were obtained in real-time with a multi-frequency linear probe PERFORMED BY:  Myself IMAGES ARCHIVED?: Yes SIDE:Left BODY PART:Lower extremity FINDINGS: Abcess present and Cellulitis present INTERPRETATION:  Abcess present and Cellulitis present   INCISION AND DRAINAGE Performed by: Enid SkeensZAVITZ, Suki Crockett M Consent: Verbal consent obtained. Risks and benefits: risks, benefits and alternatives were discussed Type: abscess  Body area: Left dorsum foot Anesthesia: local infiltration Incision was made with a scalpel. Local anesthetic: lidocaine Anesthetic total: 6 ml Complexity: Complex foot  Blunt dissection to break up loculations Drainage: Significant pus   Patient tolerance: Patient tolerated the procedure well with no immediate complications.    Labs Review Labs Reviewed - No data to  display  Imaging Review No results found.   EKG Interpretation None      MDM   Final diagnoses:  Foot abscess, right  Cellulitis of right foot   Patient with abscess and cellulitis the right foot. Patient healthy otherwise and not immunosuppressive. Patient denies systemic symptoms is well-appearing in ER smiling and conversant. Incision and drainage performed after ultrasound showed abscess. Discussed strict followup with orthopedics and reminded patient that foot infections can become very severe. Patient comfortable the plan. Plan for work note and discussed regular soaks of the wound. Nursing soaked the wound in the ER after I&D. Pain meds and oral antibiotics in ER.  Results and differential diagnosis were discussed with the patient/parent/guardian. Close follow up outpatient was discussed, comfortable with the plan.   Medications  sulfamethoxazole-trimethoprim (BACTRIM DS) 800-160 MG per tablet 1 tablet (not administered)  cephALEXin (KEFLEX) capsule 500 mg (not administered)  ibuprofen (ADVIL,MOTRIN) tablet 600 mg (not administered)  HYDROcodone-acetaminophen (NORCO/VICODIN) 5-325 MG per tablet 1-2 tablet (not administered)  lidocaine (XYLOCAINE) 2 % injection (  Given by Other 11/13/13 0527)    Filed Vitals:   11/13/13 0506  BP: 139/87  Pulse: 110  Temp: 98.5 F (36.9 C)  TempSrc: Oral  Resp: 16  Height: 5\' 2"  (1.575 m)  Weight: 130 lb (58.968 kg)  SpO2: 100%        Enid Skeens, MD 11/13/13 [phone]

## 2013-11-13 NOTE — ED Notes (Signed)
Pt states right foot pain that started a week ago. Pt states foot now is swollen & painful.

## 2014-01-09 ENCOUNTER — Emergency Department (HOSPITAL_COMMUNITY): Payer: Self-pay

## 2014-01-09 ENCOUNTER — Emergency Department (HOSPITAL_COMMUNITY)
Admission: EM | Admit: 2014-01-09 | Discharge: 2014-01-09 | Disposition: A | Discharge status: Home / Self Care | Payer: Self-pay | Attending: Emergency Medicine | Admitting: Emergency Medicine

## 2014-01-09 ENCOUNTER — Encounter (HOSPITAL_COMMUNITY): Payer: Self-pay | Admitting: Emergency Medicine

## 2014-01-09 DIAGNOSIS — Z79899 Other long term (current) drug therapy: Secondary | ICD-10-CM | POA: Insufficient documentation

## 2014-01-09 DIAGNOSIS — J029 Acute pharyngitis, unspecified: Secondary | ICD-10-CM | POA: Insufficient documentation

## 2014-01-09 DIAGNOSIS — F17200 Nicotine dependence, unspecified, uncomplicated: Secondary | ICD-10-CM | POA: Insufficient documentation

## 2014-01-09 DIAGNOSIS — Z792 Long term (current) use of antibiotics: Secondary | ICD-10-CM | POA: Insufficient documentation

## 2014-01-09 DIAGNOSIS — J189 Pneumonia, unspecified organism: Secondary | ICD-10-CM | POA: Insufficient documentation

## 2014-01-09 MED ORDER — ALBUTEROL SULFATE HFA 108 (90 BASE) MCG/ACT IN AERS: 2.0000 | INHALATION_SPRAY | RESPIRATORY_TRACT | Status: DC | PRN

## 2014-01-09 MED ORDER — AZITHROMYCIN 250 MG PO TABS: 250.0000 mg | ORAL_TABLET | Freq: Every day | ORAL | Status: DC

## 2014-01-09 NOTE — ED Provider Notes (Signed)
CSN: [id number]     Arrival date & time 01/09/14  1513 History   First MD Initiated Contact with Patient 01/09/14 1530     Chief Complaint  Patient presents with  . Cough     (Consider location/radiation/quality/duration/timing/severity/associated sxs/prior Treatment) HPI Comments: 28 year old male, presents with a complaint of a cough which has been present for 3 weeks, gradual in onset, gradually worsening and now is more frequent and associated with some right-sided chest pain when he coughs. He has associated nasal congestion and a runny nose which is what started the illness. He denies fevers or chills, he has been able to maintain his job where he works at [employer]. He denies swelling of the legs, vomiting, diarrhea or rashes. He denies any immunocompromise.  Patient is a 28 y.o. male presenting with cough. The history is provided by the patient.  Cough   History reviewed. No pertinent past medical history. Past Surgical History  Procedure Laterality Date  . Thoracentesis     No family history on file. History  Substance Use Topics  . Smoking status: Current Some Day Smoker    Types: Cigarettes  . Smokeless tobacco: Not on file  . Alcohol Use: No    Review of Systems  Respiratory: Positive for cough.   All other systems reviewed and are negative.     Allergies  Review of patient's allergies indicates no known allergies.  Home Medications   Prior to Admission medications   Medication Sig Start Date End Date Taking? Authorizing Provider  dextromethorphan-guaiFENesin (MUCINEX DM) 30-600 MG per 12 hr tablet Take 1 tablet by mouth daily as needed for cough.   Yes Historical Provider, MD  DiphenhydrAMINE HCl (ALLERGY MED PO) Take 1 tablet by mouth daily as needed (for allergy relief).   Yes Historical Provider, MD  Phenylephrine-DM-GG-APAP (MUCINEX FAST-MAX COLD FLU) 5-10-200-325 MG/10ML LIQD Take 10-15 mLs by mouth daily as needed (for cold and flu  symptoms).   Yes Historical Provider, MD  Phenylephrine-DM-GG-APAP (TYLENOL COLD/FLU SEVERE) 5-10-200-325 MG TABS Take 2 tablets by mouth daily as needed (for cold symptoms).   Yes Historical Provider, MD  albuterol (PROVENTIL HFA;VENTOLIN HFA) 108 (90 BASE) MCG/ACT inhaler Inhale 2 puffs into the lungs every 4 (four) hours as needed for wheezing or shortness of breath. 01/09/14   Vida Roller, MD  azithromycin (ZITHROMAX Z-PAK) 250 MG tablet Take 1 tablet (250 mg total) by mouth daily.  PO day 1, then  PO days 205 01/09/14   Vida Roller, MD   BP 109/70  Pulse 70  Temp(Src) 99.3 F (37.4 C) (Oral)  Resp 18  Ht  (1.575 m)  Wt 130 lb (58.968 kg)  BMI 23.77 kg/m2  SpO2 100% Physical Exam  Nursing note and vitals reviewed. Constitutional: He appears well-developed and well-nourished. No distress.  HENT:  Head: Normocephalic and atraumatic.  Mouth/Throat: Oropharynx is clear and moist. No oropharyngeal exudate.  Eyes: Conjunctivae and EOM are normal. Pupils are equal, round, and reactive to light. Right eye exhibits no discharge. Left eye exhibits no discharge. No scleral icterus.  Neck: Normal range of motion. Neck supple. No JVD present. No thyromegaly present.  Cardiovascular: Normal rate, regular rhythm, normal heart sounds and intact distal pulses.  Exam reveals no gallop and no friction rub.   No murmur heard. Pulmonary/Chest: Effort normal. No respiratory distress.  Breath sounds diminished at the bases, slight expiratory wheeze, no rales. Speaks in full sentences, in no respiratory distress, no accessory muscle  use.  Abdominal: Soft. Bowel sounds are normal. He exhibits no distension and no mass. There is no tenderness.  Musculoskeletal: Normal range of motion. He exhibits no edema and no tenderness.  Lymphadenopathy:    He has no cervical adenopathy.  Neurological: He is alert. Coordination normal.  Skin: Skin is warm and dry. No rash noted. No erythema.   Psychiatric: He has a normal mood and affect. His behavior is normal.    ED Course  Procedures (including critical care time) Labs Review Labs Reviewed - No data to display  Imaging Review Dg Chest 2 View  01/09/2014   CLINICAL DATA:  Cough with chest congestion and tightness of a right chest  EXAM: CHEST  2 VIEW  COMPARISON:  PA and lateral chest dated June 08, 2009  FINDINGS: There is mildly increased interstitial density diffusely over the right hemithorax. There is pleural thickening versus pleural fluid laterally on the right which is not new. The left lung is well-expanded and clear. The heart and pulmonary vascularity are normal. The observed bony structures are unremarkable.  IMPRESSION: Increased density in the left hemithorax is consistent with subsegmental atelectasis or mild interstitial infiltrate. These may reflect underlying reactive airway disease or acute bronchitis. There is no focal pneumonia.   Electronically Signed   By: David  [place]   On: 01/09/2014 15:57      MDM   Final diagnoses:  Walking pneumonia    The patient is well-appearing, his vital signs are normal without fever tachycardia or hypoxia on room air. His symptoms suggest a prolonged upper respiratory or possibly a lower respiratory infection, possibly walking pneumonia with atypical bacteria. He will be given antibiotics and a albuterol inhaler for treatment at home. The patient is in agreement and has a family doctor with whom he can followup, Dr. Haydee Monica shows no new findings, and no signs of pneumonia, no pleural effusions, no pneumothorax  Meds given in ED:  Medications - No data to display  New Prescriptions   ALBUTEROL (PROVENTIL HFA;VENTOLIN HFA) 108 (90 BASE) MCG/ACT INHALER    Inhale 2 puffs into the lungs every 4 (four) hours as needed for wheezing or shortness of breath.   AZITHROMYCIN (ZITHROMAX Z-PAK) 250 MG TABLET    Take 1 tablet (250 mg total) by mouth daily.  PO day 1,  then  PO days 205       Vida Roller, MD 01/09/14 1606

## 2014-01-09 NOTE — ED Notes (Signed)
Pt reports productive cough/ runny nose x 3 weeks, states cough is getting worse sputum is clear.

## 2014-01-09 NOTE — Discharge Instructions (Signed)
Please call your doctor for a followup appointment within 24-48 hours. When you talk to your doctor please let them know that you were seen in the emergency department and have them acquire all of your records so that they can discuss the findings with you and formulate a treatment plan to fully care for your new and ongoing problems. ° °

## 2014-12-02 ENCOUNTER — Encounter (HOSPITAL_COMMUNITY): Payer: Self-pay | Admitting: Emergency Medicine

## 2014-12-02 ENCOUNTER — Emergency Department (HOSPITAL_COMMUNITY): Payer: Self-pay

## 2014-12-02 ENCOUNTER — Emergency Department (HOSPITAL_COMMUNITY)
Admission: EM | Admit: 2014-12-02 | Discharge: 2014-12-02 | Disposition: A | Discharge status: Home / Self Care | Payer: Self-pay | Attending: Emergency Medicine | Admitting: Emergency Medicine

## 2014-12-02 DIAGNOSIS — Z8719 Personal history of other diseases of the digestive system: Secondary | ICD-10-CM | POA: Insufficient documentation

## 2014-12-02 DIAGNOSIS — E860 Dehydration: Secondary | ICD-10-CM | POA: Insufficient documentation

## 2014-12-02 DIAGNOSIS — R197 Diarrhea, unspecified: Secondary | ICD-10-CM | POA: Insufficient documentation

## 2014-12-02 DIAGNOSIS — R109 Unspecified abdominal pain: Secondary | ICD-10-CM | POA: Insufficient documentation

## 2014-12-02 HISTORY — DX: Ulcerative colitis, unspecified, without complications: K51.90

## 2014-12-02 LAB — LIPASE, BLOOD: LIPASE: 33 U/L (ref 22–51)

## 2014-12-02 LAB — COMPREHENSIVE METABOLIC PANEL
ALK PHOS: 80 U/L (ref 38–126)
ALT: 39 U/L (ref 17–63)
ANION GAP: 11 (ref 5–15)
AST: 48 U/L — ABNORMAL HIGH (ref 15–41)
Albumin: 4.9 g/dL (ref 3.5–5.0)
BILIRUBIN TOTAL: 1.1 mg/dL (ref 0.3–1.2)
BUN: 40 mg/dL — AB (ref 6–20)
CHLORIDE: 99 mmol/L — AB (ref 101–111)
CO2: 21 mmol/L — AB (ref 22–32)
CREATININE: 2 mg/dL — AB (ref 0.61–1.24)
Calcium: 9.6 mg/dL (ref 8.9–10.3)
GFR calc Af Amer: 50 mL/min — ABNORMAL LOW (ref >60)
GFR, EST NON AFRICAN AMERICAN: 43 mL/min — AB (ref >60)
Glucose, Bld: 98 mg/dL (ref 65–99)
Potassium: 3.8 mmol/L (ref 3.5–5.1)
Sodium: 131 mmol/L — ABNORMAL LOW (ref 135–145)
Total Protein: 10 g/dL — ABNORMAL HIGH (ref 6.5–8.1)

## 2014-12-02 LAB — CBC WITH DIFFERENTIAL/PLATELET
BASOS ABS: 0 10*3/uL (ref 0.0–0.1)
BASOS PCT: 0 % (ref 0–1)
EOS ABS: 0.1 10*3/uL (ref 0.0–0.7)
EOS PCT: 1 % (ref 0–5)
HEMATOCRIT: 42.4 % (ref 39.0–52.0)
HEMOGLOBIN: 15.4 g/dL (ref 13.0–17.0)
LYMPHS PCT: 25 % (ref 12–46)
Lymphs Abs: 1.4 10*3/uL (ref 0.7–4.0)
MCH: 30.9 pg (ref 26.0–34.0)
MCHC: 36.3 g/dL — ABNORMAL HIGH (ref 30.0–36.0)
MCV: 85.1 fL (ref 78.0–100.0)
MONO ABS: 0.5 10*3/uL (ref 0.1–1.0)
Monocytes Relative: 8 % (ref 3–12)
Neutro Abs: 3.7 10*3/uL (ref 1.7–7.7)
Neutrophils Relative %: 66 % (ref 43–77)
Platelets: 237 10*3/uL (ref 150–400)
RBC: 4.98 MIL/uL (ref 4.22–5.81)
RDW: 13.5 % (ref 11.5–15.5)
WBC: 5.6 10*3/uL (ref 4.0–10.5)

## 2014-12-02 MED ORDER — ONDANSETRON HCL 4 MG/2ML IJ SOLN
4.0000 mg | Freq: Once | INTRAMUSCULAR | Status: AC
  Administered 2014-12-02: 4 mg via INTRAVENOUS
  Filled 2014-12-02: qty 2

## 2014-12-02 MED ORDER — HYDROMORPHONE HCL 1 MG/ML IJ SOLN
1.0000 mg | Freq: Once | INTRAMUSCULAR | Status: AC
Start: 2014-12-02 — End: 2014-12-02
  Administered 2014-12-02: 1 mg via INTRAVENOUS
  Filled 2014-12-02: qty 1

## 2014-12-02 MED ORDER — SODIUM CHLORIDE 0.9 % IV BOLUS (SEPSIS)
1000.0000 mL | Freq: Once | INTRAVENOUS | Status: AC
  Administered 2014-12-02: 1000 mL via INTRAVENOUS

## 2014-12-02 MED ORDER — TRAMADOL HCL 50 MG PO TABS: 50.0000 mg | ORAL_TABLET | Freq: Four times a day (QID) | ORAL | Status: DC | PRN

## 2014-12-02 MED ORDER — ONDANSETRON 4 MG PO TBDP: ORAL_TABLET | ORAL | Status: DC

## 2014-12-02 NOTE — Discharge Instructions (Signed)
Follow up with dr. Felecia Shelling next week.  Drink plenty of fluids

## 2014-12-02 NOTE — ED Notes (Signed)
PT c/o diarrhea/mucous stools x7 days and nausea increased with po intake. PT c/o pain/cramps to middle of abdomen. PT denies any recent antibiotic use.

## 2014-12-05 NOTE — ED Provider Notes (Signed)
CSN: [id number]     Arrival date & time 12/02/14  1638 History   First MD Initiated Contact with Patient 12/02/14 1737     Chief Complaint  Patient presents with  . Abdominal Pain     (Consider location/radiation/quality/duration/timing/severity/associated sxs/prior Treatment) Patient is a 29 y.o. male presenting with abdominal pain. The history is provided by the patient (pt complains of weakness and diarrhea for a few days.  no vomiting now).  Abdominal Pain Pain location:  Generalized Pain quality: aching   Pain radiates to:  Does not radiate Pain severity:  Mild Onset quality:  Gradual Timing:  Intermittent Chronicity:  New Context: not alcohol use   Associated symptoms: no chest pain, no cough, no diarrhea, no fatigue and no hematuria     Past Medical History  Diagnosis Date  . Ulcerative colitis    Past Surgical History  Procedure Laterality Date  . Thoracentesis     History reviewed. No pertinent family history. Social History  Substance Use Topics  . Smoking status: Never Smoker   . Smokeless tobacco: None  . Alcohol Use: Yes     Comment: occassional     Review of Systems  Constitutional: Negative for appetite change and fatigue.  HENT: Negative for congestion, ear discharge and sinus pressure.   Eyes: Negative for discharge.  Respiratory: Negative for cough.   Cardiovascular: Negative for chest pain.  Gastrointestinal: Positive for abdominal pain. Negative for diarrhea.  Genitourinary: Negative for frequency and hematuria.  Musculoskeletal: Negative for back pain.  Skin: Negative for rash.  Neurological: Negative for seizures and headaches.  Psychiatric/Behavioral: Negative for hallucinations.      Allergies  Review of patient's allergies indicates no known allergies.  Home Medications   Prior to Admission medications   Medication Sig Start Date End Date Taking? Authorizing Provider  albuterol (PROVENTIL HFA;VENTOLIN HFA) 108 (90 BASE) MCG/ACT  inhaler Inhale 2 puffs into the lungs every 4 (four) hours as needed for wheezing or shortness of breath. 01/09/14  Yes Eber Hong, MD  bismuth subsalicylate (PEPTO BISMOL) 262 MG chewable tablet Chew 524 mg by mouth as needed for indigestion or diarrhea or loose stools.   Yes Historical Provider, MD  ondansetron (ZOFRAN ODT) 4 MG disintegrating tablet  ODT q4 hours prn nausea/vomit 12/02/14   Bethann Berkshire, MD  traMADol (ULTRAM) 50 MG tablet Take 1 tablet (50 mg total) by mouth every 6 (six) hours as needed. 12/02/14   Bethann Berkshire, MD   BP 110/74 mmHg  Pulse 65  Temp(Src) 98.2 F (36.8 C) (Oral)  Resp 18  Ht  (1.575 m)  Wt 117 lb 1 oz (53.099 kg)  BMI 21.41 kg/m2  SpO2 100% Physical Exam  Constitutional: He is oriented to person, place, and time. He appears well-developed.  HENT:  Head: Normocephalic.  Eyes: Conjunctivae and EOM are normal. No scleral icterus.  Neck: Neck supple. No thyromegaly present.  Cardiovascular: Normal rate and regular rhythm.  Exam reveals no gallop and no friction rub.   No murmur heard. Pulmonary/Chest: No stridor. He has no wheezes. He has no rales. He exhibits no tenderness.  Abdominal: He exhibits no distension. There is no rebound.  Musculoskeletal: Normal range of motion. He exhibits no edema.  Lymphadenopathy:    He has no cervical adenopathy.  Neurological: He is oriented to person, place, and time. He exhibits normal muscle tone. Coordination normal.  Skin: No rash noted. No erythema.  Psychiatric: He has a normal mood and affect. His behavior  is normal.    ED Course  Procedures (including critical care time) Labs Review Labs Reviewed  CBC WITH DIFFERENTIAL/PLATELET - Abnormal; Notable for the following:    MCHC 36.3 (*)    All other components within normal limits  COMPREHENSIVE METABOLIC PANEL - Abnormal; Notable for the following:    Sodium 131 (*)    Chloride 99 (*)    CO2 21 (*)    BUN 40 (*)    Creatinine, Ser 2.00 (*)     Total Protein 10.0 (*)    AST 48 (*)    GFR calc non Af Amer 43 (*)    GFR calc Af Amer 50 (*)    All other components within normal limits  LIPASE, BLOOD    Imaging Review No results found. I, Cataleyah Colborn L, personally reviewed and evaluated these images and lab results as part of my medical decision-making.   EKG Interpretation None      MDM   Final diagnoses:  Dehydration   Labs show dehydration,  Pt improved with fluids.  Most likely viral gastroenteritis.  Doubt,  crohns or bacterial infection.  Pt to follow up with pcp to make sure creatinine and symptoms improve    Bethann Berkshire, MD 12/05/14 [phone]

## 2014-12-08 ENCOUNTER — Inpatient Hospital Stay (HOSPITAL_COMMUNITY)
Admission: EM | Admit: 2014-12-08 | Discharge: 2014-12-12 | DRG: 371 | Disposition: A | Discharge status: Home / Self Care | Payer: Medicaid Other | Attending: Internal Medicine | Admitting: Internal Medicine

## 2014-12-08 ENCOUNTER — Emergency Department (HOSPITAL_COMMUNITY): Payer: Medicaid Other

## 2014-12-08 ENCOUNTER — Encounter (HOSPITAL_COMMUNITY): Payer: Self-pay | Admitting: Emergency Medicine

## 2014-12-08 ENCOUNTER — Inpatient Hospital Stay (HOSPITAL_COMMUNITY): Payer: Medicaid Other

## 2014-12-08 DIAGNOSIS — A0811 Acute gastroenteropathy due to Norwalk agent: Secondary | ICD-10-CM | POA: Diagnosis present

## 2014-12-08 DIAGNOSIS — E872 Acidosis, unspecified: Secondary | ICD-10-CM | POA: Diagnosis present

## 2014-12-08 DIAGNOSIS — E43 Unspecified severe protein-calorie malnutrition: Secondary | ICD-10-CM | POA: Diagnosis present

## 2014-12-08 DIAGNOSIS — R059 Cough, unspecified: Secondary | ICD-10-CM

## 2014-12-08 DIAGNOSIS — R197 Diarrhea, unspecified: Secondary | ICD-10-CM | POA: Diagnosis present

## 2014-12-08 DIAGNOSIS — R1032 Left lower quadrant pain: Secondary | ICD-10-CM

## 2014-12-08 DIAGNOSIS — E876 Hypokalemia: Secondary | ICD-10-CM | POA: Diagnosis present

## 2014-12-08 DIAGNOSIS — Z9119 Patient's noncompliance with other medical treatment and regimen: Secondary | ICD-10-CM | POA: Diagnosis present

## 2014-12-08 DIAGNOSIS — K5190 Ulcerative colitis, unspecified, without complications: Secondary | ICD-10-CM | POA: Diagnosis present

## 2014-12-08 DIAGNOSIS — E860 Dehydration: Secondary | ICD-10-CM | POA: Diagnosis present

## 2014-12-08 DIAGNOSIS — N179 Acute kidney failure, unspecified: Secondary | ICD-10-CM | POA: Diagnosis present

## 2014-12-08 DIAGNOSIS — N19 Unspecified kidney failure: Secondary | ICD-10-CM

## 2014-12-08 DIAGNOSIS — A539 Syphilis, unspecified: Secondary | ICD-10-CM | POA: Diagnosis present

## 2014-12-08 DIAGNOSIS — B20 Human immunodeficiency virus [HIV] disease: Secondary | ICD-10-CM | POA: Diagnosis present

## 2014-12-08 DIAGNOSIS — E46 Unspecified protein-calorie malnutrition: Secondary | ICD-10-CM | POA: Diagnosis present

## 2014-12-08 DIAGNOSIS — E861 Hypovolemia: Secondary | ICD-10-CM | POA: Diagnosis present

## 2014-12-08 DIAGNOSIS — E871 Hypo-osmolality and hyponatremia: Secondary | ICD-10-CM | POA: Diagnosis present

## 2014-12-08 DIAGNOSIS — R109 Unspecified abdominal pain: Secondary | ICD-10-CM | POA: Diagnosis present

## 2014-12-08 DIAGNOSIS — R10A0 Flank pain, unspecified side: Secondary | ICD-10-CM | POA: Diagnosis present

## 2014-12-08 DIAGNOSIS — R05 Cough: Secondary | ICD-10-CM

## 2014-12-08 DIAGNOSIS — A072 Cryptosporidiosis: Secondary | ICD-10-CM | POA: Diagnosis present

## 2014-12-08 HISTORY — DX: Human immunodeficiency virus (HIV) disease: B20

## 2014-12-08 HISTORY — DX: Acute gastroenteropathy due to Norwalk agent: A08.11

## 2014-12-08 HISTORY — DX: Other pericardial effusion (noninflammatory): I31.39

## 2014-12-08 HISTORY — DX: Pyothorax without fistula: J86.9

## 2014-12-08 HISTORY — DX: Asymptomatic human immunodeficiency virus (hiv) infection status: Z21

## 2014-12-08 HISTORY — DX: Pericardial effusion (noninflammatory): I31.3

## 2014-12-08 HISTORY — DX: Gastrointestinal hemorrhage, unspecified: K92.2

## 2014-12-08 HISTORY — DX: Methicillin resistant Staphylococcus aureus infection as the cause of diseases classified elsewhere: B95.62

## 2014-12-08 HISTORY — DX: Bacteremia: R78.81

## 2014-12-08 HISTORY — DX: Pneumonia, unspecified organism: J18.9

## 2014-12-08 HISTORY — DX: Anemia, unspecified: D64.9

## 2014-12-08 HISTORY — DX: Syphilis, unspecified: A53.9

## 2014-12-08 HISTORY — DX: Cryptosporidiosis: A07.2

## 2014-12-08 LAB — CBC WITH DIFFERENTIAL/PLATELET
BASOS ABS: 0 10*3/uL (ref 0.0–0.1)
Basophils Relative: 0 % (ref 0–1)
EOS PCT: 0 % (ref 0–5)
Eosinophils Absolute: 0 10*3/uL (ref 0.0–0.7)
HEMATOCRIT: 42.3 % (ref 39.0–52.0)
Hemoglobin: 15.8 g/dL (ref 13.0–17.0)
LYMPHS ABS: 1.1 10*3/uL (ref 0.7–4.0)
LYMPHS PCT: 16 % (ref 12–46)
MCH: 30.7 pg (ref 26.0–34.0)
MCHC: 37.4 g/dL — ABNORMAL HIGH (ref 30.0–36.0)
MCV: 82.1 fL (ref 78.0–100.0)
Monocytes Absolute: 0.5 10*3/uL (ref 0.1–1.0)
Monocytes Relative: 7 % (ref 3–12)
NEUTROS ABS: 5.2 10*3/uL (ref 1.7–7.7)
Neutrophils Relative %: 77 % (ref 43–77)
Platelets: 223 10*3/uL (ref 150–400)
RBC: 5.15 MIL/uL (ref 4.22–5.81)
RDW: 13.3 % (ref 11.5–15.5)
WBC: 6.8 10*3/uL (ref 4.0–10.5)

## 2014-12-08 LAB — TSH: TSH: 1.704 u[IU]/mL (ref 0.350–4.500)

## 2014-12-08 LAB — COMPREHENSIVE METABOLIC PANEL
ALT: 30 U/L (ref 17–63)
AST: 44 U/L — ABNORMAL HIGH (ref 15–41)
Albumin: 4.8 g/dL (ref 3.5–5.0)
Alkaline Phosphatase: 85 U/L (ref 38–126)
Anion gap: 15 (ref 5–15)
BUN: 75 mg/dL — ABNORMAL HIGH (ref 6–20)
CHLORIDE: 93 mmol/L — AB (ref 101–111)
CO2: 18 mmol/L — AB (ref 22–32)
CREATININE: 1.94 mg/dL — AB (ref 0.61–1.24)
Calcium: 8.8 mg/dL — ABNORMAL LOW (ref 8.9–10.3)
GFR, EST AFRICAN AMERICAN: 52 mL/min — AB (ref >60)
GFR, EST NON AFRICAN AMERICAN: 45 mL/min — AB (ref >60)
Glucose, Bld: 117 mg/dL — ABNORMAL HIGH (ref 65–99)
POTASSIUM: 2.8 mmol/L — AB (ref 3.5–5.1)
SODIUM: 126 mmol/L — AB (ref 135–145)
Total Bilirubin: 1.2 mg/dL (ref 0.3–1.2)
Total Protein: 9.6 g/dL — ABNORMAL HIGH (ref 6.5–8.1)

## 2014-12-08 LAB — C DIFFICILE QUICK SCREEN W PCR REFLEX
C DIFFICLE (CDIFF) ANTIGEN: NEGATIVE
C Diff interpretation: NEGATIVE
C Diff toxin: NEGATIVE

## 2014-12-08 LAB — MAGNESIUM
MAGNESIUM: 1.6 mg/dL — AB (ref 1.7–2.4)
Magnesium: 1.7 mg/dL (ref 1.7–2.4)

## 2014-12-08 LAB — URINALYSIS, ROUTINE W REFLEX MICROSCOPIC
Bilirubin Urine: NEGATIVE
Glucose, UA: NEGATIVE mg/dL
KETONES UR: NEGATIVE mg/dL
LEUKOCYTES UA: NEGATIVE
Nitrite: NEGATIVE
PH: 6 (ref 5.0–8.0)
Protein, ur: 30 mg/dL — AB
Specific Gravity, Urine: 1.025 (ref 1.005–1.030)
Urobilinogen, UA: 0.2 mg/dL (ref 0.0–1.0)

## 2014-12-08 LAB — LIPASE, BLOOD: LIPASE: 20 U/L — AB (ref 22–51)

## 2014-12-08 LAB — URINE MICROSCOPIC-ADD ON

## 2014-12-08 LAB — MRSA PCR SCREENING: MRSA BY PCR: NEGATIVE

## 2014-12-08 LAB — LACTIC ACID, PLASMA: Lactic Acid, Venous: 1.2 mmol/L (ref 0.5–2.0)

## 2014-12-08 MED ORDER — POTASSIUM CHLORIDE 10 MEQ/100ML IV SOLN
10.0000 meq | INTRAVENOUS | Status: AC
  Administered 2014-12-08 (×2): 10 meq via INTRAVENOUS
  Filled 2014-12-08 (×2): qty 100

## 2014-12-08 MED ORDER — ACETAMINOPHEN 650 MG RE SUPP: 650.0000 mg | Freq: Four times a day (QID) | RECTAL | Status: DC | PRN

## 2014-12-08 MED ORDER — HEPARIN SODIUM (PORCINE) 5000 UNIT/ML IJ SOLN
5000.0000 [IU] | Freq: Three times a day (TID) | INTRAMUSCULAR | Status: DC
  Administered 2014-12-08 – 2014-12-12 (×12): 5000 [IU] via SUBCUTANEOUS
  Filled 2014-12-08 (×12): qty 1

## 2014-12-08 MED ORDER — ADULT MULTIVITAMIN W/MINERALS CH
1.0000 | ORAL_TABLET | Freq: Every day | ORAL | Status: DC
  Administered 2014-12-08 – 2014-12-12 (×5): 1 via ORAL
  Filled 2014-12-08 (×5): qty 1

## 2014-12-08 MED ORDER — SODIUM CHLORIDE 0.9 % IV SOLN
1000.0000 mL | INTRAVENOUS | Status: DC
  Administered 2014-12-08: 1000 mL via INTRAVENOUS

## 2014-12-08 MED ORDER — HYDROCODONE-ACETAMINOPHEN 5-325 MG PO TABS
1.0000 | ORAL_TABLET | ORAL | Status: DC | PRN
  Administered 2014-12-09 – 2014-12-10 (×2): 1 via ORAL
  Filled 2014-12-08 (×3): qty 1

## 2014-12-08 MED ORDER — POTASSIUM CHLORIDE CRYS ER 20 MEQ PO TBCR
40.0000 meq | EXTENDED_RELEASE_TABLET | Freq: Once | ORAL | Status: AC
  Administered 2014-12-08: 40 meq via ORAL
  Filled 2014-12-08: qty 2

## 2014-12-08 MED ORDER — SODIUM CHLORIDE 0.45 % IV SOLN
INTRAVENOUS | Status: DC
  Administered 2014-12-08 – 2014-12-12 (×7): via INTRAVENOUS
  Filled 2014-12-08 (×13): qty 1000

## 2014-12-08 MED ORDER — ONDANSETRON HCL 4 MG/2ML IJ SOLN
4.0000 mg | Freq: Four times a day (QID) | INTRAMUSCULAR | Status: DC | PRN
  Administered 2014-12-09 – 2014-12-11 (×5): 4 mg via INTRAVENOUS
  Filled 2014-12-08 (×5): qty 2

## 2014-12-08 MED ORDER — ONDANSETRON HCL 4 MG/2ML IJ SOLN
4.0000 mg | Freq: Once | INTRAMUSCULAR | Status: AC
  Administered 2014-12-08: 4 mg via INTRAVENOUS

## 2014-12-08 MED ORDER — SODIUM CHLORIDE 0.9 % IV SOLN
1000.0000 mL | Freq: Once | INTRAVENOUS | Status: AC
  Administered 2014-12-08: 1000 mL via INTRAVENOUS

## 2014-12-08 MED ORDER — MORPHINE SULFATE (PF) 2 MG/ML IV SOLN
1.0000 mg | INTRAVENOUS | Status: DC | PRN
  Administered 2014-12-09 – 2014-12-11 (×5): 1 mg via INTRAVENOUS
  Filled 2014-12-08 (×5): qty 1

## 2014-12-08 MED ORDER — SODIUM CHLORIDE 0.9 % IJ SOLN
3.0000 mL | Freq: Two times a day (BID) | INTRAMUSCULAR | Status: DC
  Administered 2014-12-08 – 2014-12-12 (×7): 3 mL via INTRAVENOUS

## 2014-12-08 MED ORDER — TRAMADOL HCL 50 MG PO TABS: 50.0000 mg | ORAL_TABLET | Freq: Four times a day (QID) | ORAL | Status: DC | PRN

## 2014-12-08 MED ORDER — ONDANSETRON HCL 4 MG PO TABS: 4.0000 mg | ORAL_TABLET | Freq: Four times a day (QID) | ORAL | Status: DC | PRN

## 2014-12-08 MED ORDER — MAGNESIUM SULFATE IN D5W 10-5 MG/ML-% IV SOLN
1.0000 g | Freq: Once | INTRAVENOUS | Status: AC
  Administered 2014-12-08: 1 g via INTRAVENOUS
  Filled 2014-12-08: qty 100

## 2014-12-08 MED ORDER — ONDANSETRON HCL 4 MG/2ML IJ SOLN
INTRAMUSCULAR | Status: AC
  Administered 2014-12-08: 4 mg via INTRAVENOUS
  Filled 2014-12-08: qty 2

## 2014-12-08 MED ORDER — IOHEXOL 300 MG/ML  SOLN: 50.0000 mL | INTRAMUSCULAR | Status: AC

## 2014-12-08 MED ORDER — ENSURE ENLIVE PO LIQD
237.0000 mL | Freq: Two times a day (BID) | ORAL | Status: DC
  Administered 2014-12-08 – 2014-12-12 (×5): 237 mL via ORAL

## 2014-12-08 MED ORDER — MAGNESIUM SULFATE 50 % IJ SOLN: 1.0000 g | Freq: Once | INTRAVENOUS | Status: DC

## 2014-12-08 MED ORDER — TRAZODONE HCL 50 MG PO TABS: 25.0000 mg | ORAL_TABLET | Freq: Every evening | ORAL | Status: DC | PRN

## 2014-12-08 MED ORDER — ACETAMINOPHEN 325 MG PO TABS: 650.0000 mg | ORAL_TABLET | Freq: Four times a day (QID) | ORAL | Status: DC | PRN

## 2014-12-08 MED ORDER — PNEUMOCOCCAL VAC POLYVALENT 25 MCG/0.5ML IJ INJ
0.5000 mL | INJECTION | INTRAMUSCULAR | Status: AC
  Administered 2014-12-10: 0.5 mL via INTRAMUSCULAR
  Filled 2014-12-08: qty 0.5

## 2014-12-08 NOTE — ED Provider Notes (Signed)
CSN: [id number]     Arrival date & time 12/08/14  0810 History   First MD Initiated Contact with Patient 12/08/14 [phone]     Chief Complaint  Patient presents with  . Back Pain     (Consider location/radiation/quality/duration/timing/severity/associated sxs/prior Treatment) HPI Comments: Patient is a 29 year old male who presents to the emergency department with a chief complaint of back pain and abdomen pain.  The patient states that he was seen in the emergency department approximately 10 days ago for similar symptoms with abdominal pain and back pain. He says that he was diagnosed with dehydration, is treated with some fluids, some pain medicine and nausea medicine. He states he felt okay for couple of days, but then the problem returned. For the last 7 days the patient has felt tired, had lower back pain that was worse when he walks. His had some abdominal discomfort as well. He states he's had about 4 episodes of diarrhea over the last 7 days. He denies any fever. There's been no injury or trauma to the abdomen or back. His been no recent operations to the abdomen or back. It is of note that he has a history of ulcerative colitis. There's been no recent hospitalizations. The patient is currently not taking any medications at this time. The patient states that he attempted to see a primary physician as he was instructed on his August 11 of visit, but he states he has to be preapproved because he does not have adequate insurance. Patient states the symptoms seem to be getting worse, so he came back to the emergency department for evaluation.  The history is provided by the patient.    Past Medical History  Diagnosis Date  . Ulcerative colitis    Past Surgical History  Procedure Laterality Date  . Thoracentesis     History reviewed. No pertinent family history. Social History  Substance Use Topics  . Smoking status: Never Smoker   . Smokeless tobacco: None  . Alcohol Use: Yes      Comment: occassional     Review of Systems  Constitutional: Positive for activity change, appetite change and fatigue. Negative for fever.  Respiratory: Negative for cough.   Cardiovascular: Negative for chest pain and leg swelling.  Gastrointestinal: Positive for abdominal pain and diarrhea. Negative for blood in stool.  Genitourinary: Negative for hematuria.  Musculoskeletal: Positive for back pain.  All other systems reviewed and are negative.     Allergies  Review of patient's allergies indicates no known allergies.  Home Medications   Prior to Admission medications   Medication Sig Start Date End Date Taking? Authorizing Provider  albuterol (PROVENTIL HFA;VENTOLIN HFA) 108 (90 BASE) MCG/ACT inhaler Inhale 2 puffs into the lungs every 4 (four) hours as needed for wheezing or shortness of breath. 01/09/14   Eber Hong, MD  bismuth subsalicylate (PEPTO BISMOL) 262 MG chewable tablet Chew 524 mg by mouth as needed for indigestion or diarrhea or loose stools.    Historical Provider, MD  ondansetron (ZOFRAN ODT) 4 MG disintegrating tablet 4mg  ODT q4 hours prn nausea/vomit 12/02/14   Bethann Berkshire, MD  traMADol (ULTRAM) 50 MG tablet Take 1 tablet (50 mg total) by mouth every 6 (six) hours as needed. 12/02/14   Bethann Berkshire, MD   BP 115/86 mmHg  Pulse 93  Temp(Src) 97.6 F (36.4 C) (Oral)  Resp 16  Ht 5\' 2"  (1.575 m)  Wt 120 lb (54.432 kg)  BMI 21.94 kg/m2  SpO2 97% Physical Exam  Constitutional: He is oriented to person, place, and time. He appears well-developed and well-nourished.  Non-toxic appearance.  HENT:  Head: Normocephalic.  Right Ear: Tympanic membrane and external ear normal.  Left Ear: Tympanic membrane and external ear normal.  Eyes: EOM and lids are normal. Pupils are equal, round, and reactive to light.  Neck: Normal range of motion. Neck supple. Carotid bruit is not present.  Cardiovascular: Normal rate, regular rhythm, normal heart sounds, intact distal  pulses and normal pulses.   Pulmonary/Chest: Breath sounds normal. No respiratory distress.  Abdominal: Soft. Bowel sounds are normal. He exhibits no distension and no mass. There is tenderness. There is no guarding.  No CVA tenderness. Mild discomfort to palpation in the epigastric, and left lower quadrant area. There is no distention appreciated.  Musculoskeletal: Normal range of motion. He exhibits no edema or tenderness.  Lymphadenopathy:       Head (right side): No submandibular adenopathy present.       Head (left side): No submandibular adenopathy present.    He has no cervical adenopathy.  Neurological: He is alert and oriented to person, place, and time. He has normal strength. No cranial nerve deficit or sensory deficit.  Skin: Skin is warm and dry.  Psychiatric: He has a normal mood and affect. His speech is normal.  Nursing note and vitals reviewed.   ED Course  Procedures (including critical care time) Labs Review Labs Reviewed  COMPREHENSIVE METABOLIC PANEL  LIPASE, BLOOD  CBC WITH DIFFERENTIAL/PLATELET  URINALYSIS, ROUTINE W REFLEX MICROSCOPIC (NOT AT [facility])    Imaging Review No results found. I have personally reviewed and evaluated these images and lab results as part of my medical decision-making.   EKG Interpretation None      MDM  Vital signs are well within normal limits. Pulse oximetry is 97% on room air. Labs reviewed. The potassium is low at 2.8. Oral potassium given. Will consider IV potassium upon return from CT. CBC wnl. CO2 low at 18. BUN up to 75, higher than recent level, Creat also elevated at 1.94.  Chest xray non-acute. CT abd neg for acute problem. Pt to be admitted due to renal failure and metabolic acidosis with hypokalemia.   Final diagnoses:  Renal failure  Metabolic acidosis    **I have reviewed nursing notes, vital signs, and all appropriate lab and imaging results for this patient.Ivery Quale, PA-C 12/10/14  1712  Raeford Razor, MD 01/06/15 [phone]

## 2014-12-08 NOTE — ED Notes (Signed)
Pt reports being seen her one week ago for abdominal pain and was diagnosed with dehydration and stomach bug.  Pt was given medication for nausea and pain.  Pt c/o bilateral flank pain with nausea when eating.  Pt also reports diarrhea. Pt alert and oriented.

## 2014-12-08 NOTE — H&P (Signed)
[facility] History and Physical  Brian Adkins NWG:[id number] DOB: 1985-06-03 DOA: 12/08/2014  Referring physician: Beverely Pace PCP: No PCP Per Patient   Chief Complaint: weakness diarrhe  HPI: Brian Adkins is a very pleasant 29 y.o. male with a past medical history that includes ulcerative colitis, proctocolitis, empyema, GI bleed, MRSA bacteremia, HIV positive, moderate pericardial effusion presents to the emergency department with the chief complaint of generalized weakness persistent diarrhea. Initial evaluation reveals acute renal failure, metabolic acidosis, severe hypokalemia as well as hyponatremia. Patient reports approximately 7 days history of persistent diarrhea. He states he is having 5-6 very watery yellow like stools and large amounts each day. Associated symptoms include intermittent abdominal pain mostly in the lower quadrants, nausea with occasional emesis. He denies coffee ground emesis. He denies melanoma bright red blood per rectum. He also reports pain bilateral flank area. He denies any dysuria hematuria frequency or urgency. He denies any recent antibiotic use. He denies fever chills headache. He denies visual disturbances dizziness syncope or near-syncope. He denies chest pain palpitation shortness of breath cough. Workup in the emergency department significant for sodium of 126 potassium 2.8 chloride 93 CO2 18 BUN 75 creatinine 1.94 glucose 117. Complete blood count unremarkable. Lactic acid 1.2, lipase 20, AST 44. Urinalysis with few squama cell epithelial cells granulated casts few bacteria, CT of the abdomen/pelvis No visible acute findings on this nonenhanced CT . Plain x-ray of abdomen with acute chest last week nonspecific bowel gas pattern. While findings do not indicate bowel obstruction, the presence of multiple air-fluid levels in the colon is compatible with the reported history of diarrhea, and could indicate active colitis. No pneumoperitoneum. No  radiographic evidence of acute cardiopulmonary disease.  In the emergency department he is afebrile hemodynamically stable and not hypoxic. He is provided with 3 L of normal saline, 4 mg of Zofran, 10 mEq of potassium chloride intravenously 2 and 40 mEq of potassium chloride orally.   Review of Systems:  10 point review of systems complete and all systems are negative except as indicated in the history of present illness  Past Medical History  Diagnosis Date  . Ulcerative colitis   . Proctocolitis   . Empyema     2010  . GI bleed   . MRSA bacteremia    Past Surgical History  Procedure Laterality Date  . Thoracentesis     Social History:  reports that he has never smoked. He does not have any smokeless tobacco history on file. He reports that he drinks alcohol. He reports that he does not use illicit drugs. He lives in [place] he is a Production designer, theatre/television/film at [employer] he is independent with ADLs No Known Allergies  History reviewed. No pertinent family history. he has 3 siblings. There collective medical history is negative for CAD, diabetes, renal failure  Prior to Admission medications   Medication Sig Start Date End Date Taking? Authorizing Provider  B Complex Vitamins (VITAMIN B COMPLEX PO) Take by mouth.   Yes Historical Provider, MD  BIOTIN PO Take 1 tablet by mouth daily.   Yes Historical Provider, MD  bismuth subsalicylate (PEPTO BISMOL) 262 MG chewable tablet Chew 524 mg by mouth as needed for indigestion or diarrhea or loose stools.   Yes Historical Provider, MD  Cyanocobalamin (VITAMIN B-12 PO) Take by mouth.   Yes Historical Provider, MD  Multiple Vitamin (MULTIVITAMIN WITH MINERALS) TABS tablet Take 1 tablet by mouth daily.   Yes Historical Provider, MD  ondansetron [facility]  ODT) 4 MG disintegrating tablet 4mg  ODT q4 hours prn nausea/vomit Patient taking differently: Take 4 mg by mouth every 4 (four) hours as needed for nausea or vomiting. 4mg  ODT q4 hours prn  nausea/vomit 12/02/14  Yes Bethann Berkshire, MD  traMADol (ULTRAM) 50 MG tablet Take 1 tablet (50 mg total) by mouth every 6 (six) hours as needed. Patient taking differently: Take 50 mg by mouth every 6 (six) hours as needed for moderate pain.  12/02/14  Yes Bethann Berkshire, MD   Physical Exam: Filed Vitals:   12/08/14 0830 12/08/14 0911 12/08/14 1124 12/08/14 1400  BP: 105/84 115/86 121/87 109/76  Pulse: 90 93 84 75  Temp:  97.6 F (36.4 C)    TempSrc:  Oral    Resp:  16 18 22   Height:  5\' 2"  (1.575 m)    Weight:  54.432 kg (120 lb)    SpO2:  97% 98% 95%    Wt Readings from Last 3 Encounters:  12/08/14 54.432 kg (120 lb)  12/02/14 53.099 kg (117 lb 1 oz)  01/09/14 58.968 kg (130 lb)    General:  Appears calm and comfortable, very thin and chronically ill appearing Eyes: PERRL, normal lids, irises & conjunctiva ENT: grossly normal hearing, mucous membranes of his mouth are dry but pink Neck: no LAD, masses or thyromegaly Cardiovascular: RRR, no m/r/g. No LE edema. Respiratory: CTA bilaterally, no w/r/r. Normal respiratory effort. Abdomen: soft, ntnd positive bowel sounds no guarding no rebounding Skin: no rash or induration seen on limited exam Musculoskeletal: grossly normal tone BUE/BLE Psychiatric: grossly normal mood and affect, speech fluent and appropriate Neurologic: grossly non-focal. Speech clear facial symmetry           Labs on Admission:  Basic Metabolic Panel:  Recent Labs Lab 12/02/14 1719 12/08/14 0915  NA 131* 126*  K 3.8 2.8*  CL 99* 93*  CO2 21* 18*  GLUCOSE 98 117*  BUN 40* 75*  CREATININE 2.00* 1.94*  CALCIUM 9.6 8.8*  MG  --  1.7   Liver Function Tests:  Recent Labs Lab 12/02/14 1719 12/08/14 0915  AST 48* 44*  ALT 39 30  ALKPHOS 80 85  BILITOT 1.1 1.2  PROT 10.0* 9.6*  ALBUMIN 4.9 4.8    Recent Labs Lab 12/02/14 1719 12/08/14 0915  LIPASE 33 20*   No results for input(s): AMMONIA in the last 168 hours. CBC:  Recent  Labs Lab 12/02/14 1719 12/08/14 0915  WBC 5.6 6.8  NEUTROABS 3.7 5.2  HGB 15.4 15.8  HCT 42.4 42.3  MCV 85.1 82.1  PLT 237 223   Cardiac Enzymes: No results for input(s): CKTOTAL, CKMB, CKMBINDEX, TROPONINI in the last 168 hours.  BNP (last 3 results) No results for input(s): BNP in the last 8760 hours.  ProBNP (last 3 results) No results for input(s): PROBNP in the last 8760 hours.  CBG: No results for input(s): GLUCAP in the last 168 hours.  Radiological Exams on Admission: Ct Abdomen Pelvis Wo Contrast  12/08/2014   CLINICAL DATA:  Left lower quadrant and bilateral flank pain. Nausea. History of ulcerative colitis.  EXAM: CT ABDOMEN AND PELVIS WITHOUT CONTRAST  TECHNIQUE: Multidetector CT imaging of the abdomen and pelvis was performed following the standard protocol without IV contrast.  COMPARISON:  None.  FINDINGS: Linear scarring in the right lung base. Left base clear. Heart is normal size. No effusions.  Paucity of intraperitoneal fat and lack of intravenous contrast somewhat limits study. Liver, gallbladder, spleen, pancreas, adrenals  and kidneys have an unremarkable unenhanced appearance. No hydronephrosis.  No evidence of bowel obstruction. Stomach, large and small bowel grossly unremarkable. No visible areas of bowel wall thickening. No visible free fluid, free air or adenopathy. Urinary bladder is unremarkable. No acute bony abnormality.  IMPRESSION: Study somewhat limited by lack of intraperitoneal fat and IV contrast. No visible acute findings on this nonenhanced CT.   Electronically Signed   By: Charlett Nose M.D.   On: 12/08/2014 12:35    EKG:   Assessment/Plan Principal Problem:   Renal failure: Presumably acute secondary to severe dehydration related to persistent diarrhea. Also some concern for chronic renal failure given his medical history. Will admit to telemetry. Will hold any nephrotoxins. Will gently hydrate with IV fluids, will monitor urine output. Will  recheck in the morning. If no improvement consider renal ultra sound Active Problems:  Metabolic acidosis: Related to GI losses secondary to persistent diarrhea and decreased oral intake. Will change IV fluids to half-normal saline with 40 of potassium and 1 amp of bicarbonate. Will recheck in the morning. Will check magnesium level as well.   Diarrhea: Etiology unclear. Patient denies recent antibiotic use. He does have a history of ulcerative colitis and proctocolitis. X-ray negative for obstruction but report indicates multiple air-fluid levels in colon compatible with reported diarrhea and could have activated colitis. He was seen by Dr.rourk in the past.  Will send stool for C. difficile PCR and GI pathogen panel. Consider GI consult    Ulcerative colitis/proctocolitis.: Review indicates lialda effective in the past. It appears patient was lost to follow-up. Consider consulting GI.     Hypokalemia: Related to above. He got 2 runs of potassium in the emergency department as well as 40 mEq orally. Will provide normal saline with 40 mEq IV to run at 125 mils per hour. Will recheck in the morning. Obtain a magnesium level    Hyponatremia: Related to #3. See above therapies    Flank pain: Likely related to #1 in the setting of severe dehydration. Supportive therapy. If no improvement consider renal ultrasound    Malnutrition: BMI 22. Chart review indicates history of HIV. Lost to follow-up. Will obtain HIV antibody, B-12, folate.   Code Status: full DVT Prophylaxis: Family Communication: sister at bedside Disposition Plan: home  Time spent: 61 minutes  [facility] Pager [phone]

## 2014-12-09 ENCOUNTER — Encounter (HOSPITAL_COMMUNITY): Payer: Self-pay | Admitting: Gastroenterology

## 2014-12-09 DIAGNOSIS — R197 Diarrhea, unspecified: Secondary | ICD-10-CM

## 2014-12-09 DIAGNOSIS — K5190 Ulcerative colitis, unspecified, without complications: Secondary | ICD-10-CM

## 2014-12-09 DIAGNOSIS — B20 Human immunodeficiency virus [HIV] disease: Secondary | ICD-10-CM | POA: Diagnosis present

## 2014-12-09 LAB — COMPREHENSIVE METABOLIC PANEL
ALT: 21 U/L (ref 17–63)
AST: 38 U/L (ref 15–41)
Albumin: 3.6 g/dL (ref 3.5–5.0)
Alkaline Phosphatase: 63 U/L (ref 38–126)
Anion gap: 5 (ref 5–15)
BUN: 29 mg/dL — ABNORMAL HIGH (ref 6–20)
CHLORIDE: 110 mmol/L (ref 101–111)
CO2: 19 mmol/L — ABNORMAL LOW (ref 22–32)
Calcium: 8.2 mg/dL — ABNORMAL LOW (ref 8.9–10.3)
Creatinine, Ser: 0.85 mg/dL (ref 0.61–1.24)
Glucose, Bld: 88 mg/dL (ref 65–99)
POTASSIUM: 3.9 mmol/L (ref 3.5–5.1)
SODIUM: 134 mmol/L — AB (ref 135–145)
Total Bilirubin: 1 mg/dL (ref 0.3–1.2)
Total Protein: 7.2 g/dL (ref 6.5–8.1)

## 2014-12-09 LAB — CBC
HCT: 34 % — ABNORMAL LOW (ref 39.0–52.0)
Hemoglobin: 12.1 g/dL — ABNORMAL LOW (ref 13.0–17.0)
MCH: 29.4 pg (ref 26.0–34.0)
MCHC: 35.6 g/dL (ref 30.0–36.0)
MCV: 82.5 fL (ref 78.0–100.0)
PLATELETS: 179 10*3/uL (ref 150–400)
RBC: 4.12 MIL/uL — ABNORMAL LOW (ref 4.22–5.81)
RDW: 13.6 % (ref 11.5–15.5)
WBC: 5.4 10*3/uL (ref 4.0–10.5)

## 2014-12-09 LAB — HIV ANTIBODY (ROUTINE TESTING W REFLEX)

## 2014-12-09 LAB — HIV 1/2 AB DIFFERENTIATION
HIV 1 AB: POSITIVE — AB
HIV 2 AB: NEGATIVE

## 2014-12-09 LAB — MAGNESIUM: MAGNESIUM: 2 mg/dL (ref 1.7–2.4)

## 2014-12-09 LAB — T-HELPER CELLS (CD4) COUNT (NOT AT ARMC)
CD4 % Helper T Cell: <1 % — ABNORMAL LOW (ref 33–55)
CD4 T Cell Abs: <10 /uL — ABNORMAL LOW (ref 400–2700)

## 2014-12-09 MED ORDER — FLUCONAZOLE 100 MG PO TABS
100.0000 mg | ORAL_TABLET | ORAL | Status: DC
  Administered 2014-12-09: 100 mg via ORAL
  Filled 2014-12-09: qty 1

## 2014-12-09 MED ORDER — SULFAMETHOXAZOLE-TRIMETHOPRIM 800-160 MG PO TABS
1.0000 | ORAL_TABLET | Freq: Every day | ORAL | Status: DC
  Administered 2014-12-09 – 2014-12-12 (×4): 1 via ORAL
  Filled 2014-12-09 (×4): qty 1

## 2014-12-09 MED ORDER — BOOST / RESOURCE BREEZE PO LIQD
1.0000 | Freq: Two times a day (BID) | ORAL | Status: DC
  Administered 2014-12-09 – 2014-12-10 (×3): 1 via ORAL

## 2014-12-09 NOTE — Progress Notes (Signed)
Initial Nutrition Assessment  DOCUMENTATION CODES:  Not applicable  INTERVENTION:  Boost Breeze po BID, each supplement provides 250 kcal and 9 grams of protein  Once diet advanced, switch to Ensure Enlive  NUTRITION DIAGNOSIS:  Inadequate oral intake related to diarrhea as evidenced by energy intake < 75% for > 7 days.  GOAL:  Patient will meet greater than or equal to 90% of their needs  MONITOR:  PO intake, Supplement acceptance, Diet advancement, Labs, I & O's, Diarrhea  REASON FOR ASSESSMENT:  Malnutrition Screening Tool    ASSESSMENT:  29 y.o. male PMhx: UC, proctocolitis, GIB,  HIV presents to ED with the chief complaint of generalized weakness persistent diarrhea (7 days, 5-6x each day). Initial evaluation reveals acute renal failure, metabolic acidosis, severe hypokalemia as well as hyponatremia in setting of severe dehydration  Patient reports that his last complete meal was a few days ago. Initially, when the diarrhea began, he was able to still eat. However as the week progressed anything he ate started to go straight through him.   He does not follow any certain diet at home, but takes many different supplements: mvi, B12, Vit C. He also reports some episodes of nausea, but he has these chronically.   Patient states his normal weight is 125-127 lbs. He states he is active and works out frequently. He was upset to learn how much weight it appears he lost. Their is no documented weight history in the past 11 months.   He was agreeable to Ensure BID. Currently on CL diet, will start with [facility] and advance as diet progresses  Diet Order:  Diet clear liquid Room service appropriate?: Yes; Fluid consistency:: Thin  Skin:  Reviewed, no issues  Last BM:  8/18 Diarrhea  Height:  Ht Readings from Last 1 Encounters:  12/08/14  (1.575 m)   Weight:  Wt Readings from Last 1 Encounters:  12/08/14 114 lb 11.2 oz (52.028 kg)   Wt Readings from Last 10 Encounters:   12/08/14 114 lb 11.2 oz (52.028 kg)  12/02/14 117 lb 1 oz (53.099 kg)  01/09/14 130 lb (58.968 kg)  11/13/13 130 lb (58.968 kg)  11/25/08 120 lb (54.432 kg)  09/09/08 115 lb (52.164 kg)   Ideal Body Weight:  53.6 kg  BMI:  Body mass index is 20.97 kg/(m^2).  Estimated Nutritional Needs:  Kcal:  1550-1800 (30-35 kcal/kg) Protein:  62-73 (1.2-1.4 g/kg) Fluid:  1.6-1.8 liters + enough to replace what is lost in stool  EDUCATION NEEDS:  No education needs identified at this time  Christophe Louis RD, LDN Nutrition Pager: [phone] 12/09/2014 1:37 PM

## 2014-12-09 NOTE — Progress Notes (Signed)
Mag-sulfate did not infuse during 1st shift.  Dr Conley Rolls notified.  Orders were to infuse now and order new labs for tomorrow morning.

## 2014-12-09 NOTE — Care Management Note (Signed)
Case Management Note  Patient Details  Name: Brian Adkins MRN: [id number] Date of Birth: 1985-10-26  Subjective/Objective:                  Pt admitted from home with diarrhea. Pt lives with his parents and will return home at discharge. Pt is independent with ADL's. Pt has no insurance or PCP.  Action/Plan: Financial counselor is aware of self pay status. Will need to arrange follow up at [facility] at discharge.  Expected Discharge Date:                  Expected Discharge Plan:  Home/Self Care  In-House Referral:  Financial Counselor  Discharge planning Services  CM Consult  Post Acute Care Choice:  NA Choice offered to:  NA  DME Arranged:    DME Agency:     HH Arranged:    HH Agency:     Status of Service:  In process, will continue to follow  Medicare Important Message Given:    Date Medicare IM Given:    Medicare IM give by:    Date Additional Medicare IM Given:    Additional Medicare Important Message give by:     If discussed at Long Length of Stay Meetings, dates discussed:    Additional Comments:  Cheryl Flash, RN 12/09/2014, 3:13 PM

## 2014-12-09 NOTE — Consult Note (Signed)
Referring Provider: Elliot Cousin, MD Primary Care Physician:  No PCP Per Patient Primary Gastroenterologist:  Roetta Sessions, MD  Reason for Consultation:  Diarrhea, h/o HIV/UC   HPI: Brian Adkins is a 29 y.o. male with PMH left-sided proctocolitis, empyema, MRSA bacteremia, HIV + diagnosed in 2010 who has been lost to follow up, presents with generalized weakness, diarrhea. Patient states he had been doing well. Hasn't been followed up for his colitis for years because he hasn't had any problems. Has been off medication for years. Previously on Lialda. After his diagnosis of left-sided proctocolitis in 2010 he was hospitalized with MRSA bacteremia, empyema and diagnosed with HIV as noted by a positive RNA. Patient never followed up with GI or infectious diseases.   Reports 7 day h/o diarrhea, 5-6 watery nonbloody stools daily associated with lower abdominal pain, nausea and some vomiting. No melena, brbpr, hematemesis. 10 pound weight loss. Poor appetite. Denies recent antibiotic use. Possible ill contact with a coworker. Symptoms started off fairly minor been progressive the course of several days. Seen initially in the ER on August 11. At that time his BUN was 40, creatinine 2, sodium 131. Patient states he was sent home. Came back 6 days later with progressive symptoms and at presentation yesterday his sodium was 128, potassium 2.8, nightly 75, creatinine 1.94. His AST is elevated at 44. Acute abdominal series on August 11 with nonspecific bowel gas pattern. CT abdomen and pelvis without contrast yesterday was a limited study without lack of intraperitoneal fat and IV contrast but no significant findings noted.  C. difficile antigen, C. difficile toxin negative. GI pathogen panel pending.    Prior to Admission medications   Medication Sig Start Date End Date Taking? Authorizing Provider  B Complex Vitamins (VITAMIN B COMPLEX PO) Take by mouth.   Yes Historical Provider, MD  BIOTIN PO Take 1  tablet by mouth daily.   Yes Historical Provider, MD  bismuth subsalicylate (PEPTO BISMOL) 262 MG chewable tablet Chew 524 mg by mouth as needed for indigestion or diarrhea or loose stools.   Yes Historical Provider, MD  Cyanocobalamin (VITAMIN B-12 PO) Take by mouth.   Yes Historical Provider, MD  Multiple Vitamin (MULTIVITAMIN WITH MINERALS) TABS tablet Take 1 tablet by mouth daily.   Yes Historical Provider, MD  ondansetron (ZOFRAN ODT) 4 MG disintegrating tablet 4mg  ODT q4 hours prn nausea/vomit Patient taking differently: Take 4 mg by mouth every 4 (four) hours as needed for nausea or vomiting. 4mg  ODT q4 hours prn nausea/vomit 12/02/14  Yes Bethann Berkshire, MD  traMADol (ULTRAM) 50 MG tablet Take 1 tablet (50 mg total) by mouth every 6 (six) hours as needed. Patient taking differently: Take 50 mg by mouth every 6 (six) hours as needed for moderate pain.  12/02/14  Yes Bethann Berkshire, MD    Current Facility-Administered Medications  Medication Dose Route Frequency Provider Last Rate Last Dose  . acetaminophen (TYLENOL) tablet 650 mg  650 mg Oral Q6H PRN Gwenyth Bender, NP       Or  . acetaminophen (TYLENOL) suppository 650 mg  650 mg Rectal Q6H PRN Lesle Chris Black, NP      . feeding supplement (ENSURE ENLIVE) (ENSURE ENLIVE) liquid 237 mL  237 mL Oral BID BM Elliot Cousin, MD   237 mL at 12/08/14 1729  . heparin injection 5,000 Units  5,000 Units Subcutaneous 3 times per day Gwenyth Bender, NP   5,000 Units at 12/09/14 0559  . HYDROcodone-acetaminophen (NORCO/VICODIN) 5-325  MG per tablet 1-2 tablet  1-2 tablet Oral Q4H PRN Gwenyth Bender, NP      . morphine 2 MG/ML injection 1 mg  1 mg Intravenous Q2H PRN Gwenyth Bender, NP   1 mg at 12/09/14 0206  . multivitamin with minerals tablet 1 tablet  1 tablet Oral Daily Gwenyth Bender, NP   1 tablet at 12/08/14 1721  . ondansetron (ZOFRAN) tablet 4 mg  4 mg Oral Q6H PRN Gwenyth Bender, NP       Or  . ondansetron [facility]) injection 4 mg  4 mg Intravenous Q6H  PRN Gwenyth Bender, NP   4 mg at 12/09/14 0207  . pneumococcal 23 valent vaccine (PNU-IMMUNE) injection 0.5 mL  0.5 mL Intramuscular Tomorrow-1000 Elliot Cousin, MD      . sodium chloride 0.45 % 1,000 mL with potassium chloride 40 mEq, sodium bicarbonate 50 mEq infusion   Intravenous Continuous Gwenyth Bender, NP 125 mL/hr at 12/09/14 0251    . sodium chloride 0.9 % injection 3 mL  3 mL Intravenous Q12H Lesle Chris Black, NP   3 mL at 12/08/14 2200  . traMADol (ULTRAM) tablet 50 mg  50 mg Oral Q6H PRN Gwenyth Bender, NP      . traZODone (DESYREL) tablet 25 mg  25 mg Oral QHS PRN Gwenyth Bender, NP        Allergies as of 12/08/2014  . (No Known Allergies)    Past Medical History  Diagnosis Date  . Ulcerative colitis     diagnosed 2010, left sided.  . Empyema     2010  . GI bleed   . MRSA bacteremia   . HIV (human immunodeficiency virus infection)     2010, patient never follow up with ID  . Anemia   . Pericardial effusion   . CAP (community acquired pneumonia)     2012    Past Surgical History  Procedure Laterality Date  . Thoracentesis    . Colonoscopy  2010    Dr. Jena Gauss. Left-sided proctocolitis    Family History  Problem Relation Age of Onset  . Colon cancer Neg Hx   . Inflammatory bowel disease Neg Hx   . Healthy Mother     Social History   Social History  . Marital Status: Single    Spouse Name: N/A  . Number of Children: 0  . Years of Education: N/A   Occupational History  . manager [employer]    Social History Main Topics  . Smoking status: Never Smoker   . Smokeless tobacco: Not on file  . Alcohol Use: 0.0 oz/week    0 Standard drinks or equivalent per week     Comment: occassional,once per month  . Drug Use: No  . Sexual Activity: Not on file   Other Topics Concern  . Not on file   Social History Narrative   Graduated from [employer]     ROS:  General:see hpi Eyes: Negative for vision changes.  ENT: Negative for hoarseness, difficulty swallowing  , nasal congestion. CV: Negative for chest pain, angina, palpitations, dyspnea on exertion, peripheral edema.  Respiratory: Negative for dyspnea at rest, dyspnea on exertion, cough, sputum, wheezing.  GI: See history of present illness. GU:  Negative for dysuria, hematuria, urinary incontinence, urinary frequency, nocturnal urination.  MS: Negative for joint pain, low back pain.  Derm: Negative for rash or itching.  Neuro: Negative for weakness, abnormal sensation, seizure, frequent headaches, memory loss,  confusion.  Psych: Negative for anxiety, depression, suicidal ideation, hallucinations.  Endo: Negative for unusual weight change.  Heme: Negative for bruising or bleeding. Allergy: Negative for rash or hives.       Physical Examination: Vital signs in last 24 hours: Temp:  [97.9 F (36.6 C)-98.9 F (37.2 C)] 98.7 F (37.1 C) (08/18 0612) Pulse Rate:  [75-89] 89 (08/18 0612) Resp:  [18-22] 20 (08/18 0612) BP: (96-121)/(54-89) 104/89 mmHg (08/18 0612) SpO2:  [95 %-100 %] 100 % (08/18 0612) Weight:  [114 lb 11.2 oz (52.028 kg)] 114 lb 11.2 oz (52.028 kg) (08/17 1430) Last BM Date: 12/08/14  General: Well-nourished, well-developed in no acute distress.  Head: Normocephalic, atraumatic.   Eyes: Conjunctiva pink, no icterus. Mouth: Oropharyngeal mucosa moist and pink , no lesions erythema or exudate. Neck: Supple without thyromegaly, masses, or lymphadenopathy.  Lungs: Clear to auscultation bilaterally.  Heart: Regular rate and rhythm, no murmurs rubs or gallops.  Abdomen: Bowel sounds are normal, nontender, nondistended, no hepatosplenomegaly or masses, no abdominal bruits or    hernia , no rebound or guarding.   Rectal: not performed Extremities: No lower extremity edema, clubbing, deformity.  Neuro: Alert and oriented x 4 , grossly normal neurologically.  Skin: Warm and dry, no rash or jaundice.   Psych: Alert and cooperative, normal mood and affect.        Intake/Output from  previous day: 08/17 0701 - 08/18 0700 In: 237 [P.O.:237] Out: -  Intake/Output this shift: Total I/O In: 120 [P.O.:120] Out: -   Lab Results: CBC  Recent Labs  12/08/14 0915 12/09/14 0438  WBC 6.8 5.4  HGB 15.8 12.1*  HCT 42.3 34.0*  MCV 82.1 82.5  PLT 223 179   BMET  Recent Labs  12/08/14 0915 12/09/14 0438  NA 126* 134*  K 2.8* 3.9  CL 93* 110  CO2 18* 19*  GLUCOSE 117* 88  BUN 75* 29*  CREATININE 1.94* 0.85  CALCIUM 8.8* 8.2*   LFT  Recent Labs  12/08/14 0915 12/09/14 0438  BILITOT 1.2 1.0  ALKPHOS 85 63  AST 44* 38  ALT 30 21  PROT 9.6* 7.2  ALBUMIN 4.8 3.6    Lipase  Recent Labs  12/08/14 0915  LIPASE 20*    PT/INR No results for input(s): LABPROT, INR in the last 72 hours.   Lab Results  Component Value Date   TSH 1.704 12/08/2014     Imaging Studies: Ct Abdomen Pelvis Wo Contrast  12/08/2014   CLINICAL DATA:  Left lower quadrant and bilateral flank pain. Nausea. History of ulcerative colitis.  EXAM: CT ABDOMEN AND PELVIS WITHOUT CONTRAST  TECHNIQUE: Multidetector CT imaging of the abdomen and pelvis was performed following the standard protocol without IV contrast.  COMPARISON:  None.  FINDINGS: Linear scarring in the right lung base. Left base clear. Heart is normal size. No effusions.  Paucity of intraperitoneal fat and lack of intravenous contrast somewhat limits study. Liver, gallbladder, spleen, pancreas, adrenals and kidneys have an unremarkable unenhanced appearance. No hydronephrosis.  No evidence of bowel obstruction. Stomach, large and small bowel grossly unremarkable. No visible areas of bowel wall thickening. No visible free fluid, free air or adenopathy. Urinary bladder is unremarkable. No acute bony abnormality.  IMPRESSION: Study somewhat limited by lack of intraperitoneal fat and IV contrast. No visible acute findings on this nonenhanced CT.   Electronically Signed   By: Charlett Nose M.D.   On: 12/08/2014 12:35   Dg Chest  2 View  12/08/2014   CLINICAL DATA:  Increasingly worse cough today  EXAM: CHEST  2 VIEW  COMPARISON:  Multiple prior studies including 12/02/2014  FINDINGS: The heart size and mediastinal contours are within normal limits. Both lungs are clear. The visualized skeletal structures are unremarkable.  IMPRESSION: No active cardiopulmonary disease.   Electronically Signed   By: Esperanza Heir M.D.   On: 12/08/2014 15:48   Dg Abd Acute W/chest  12/02/2014   CLINICAL DATA:  29 year old male with diarrhea, nausea with increase p.o. intake and mid abdominal pain cramping for the past 7 days. History of ulcerative colitis.  EXAM: DG ABDOMEN ACUTE W/ 1V CHEST  COMPARISON:  Chest x-ray 01/09/2014.  FINDINGS: Mild scarring in the periphery of the right lung base, unchanged compared to the prior examination. Lung volumes are normal. No consolidative airspace disease. No pleural effusions. No pneumothorax. No pulmonary nodule or mass noted. Pulmonary vasculature and the cardiomediastinal silhouette are within normal limits.  Air-fluid levels are present in the colon. No pathologic dilatation of small bowel or colon. No pneumoperitoneum. Calcifications projecting over the upper abdomen are favored to be pancreatic. High density material in the right lower quadrant maybe postoperative, particularly if there is prior history of partial bowel resection.  IMPRESSION: 1. Nonspecific bowel gas pattern, as above. While findings do not indicate bowel obstruction, the presence of multiple air-fluid levels in the colon is compatible with the reported history of diarrhea, and could indicate active colitis. 2. No pneumoperitoneum. 3. No radiographic evidence of acute cardiopulmonary disease.   Electronically Signed   By: Trudie Reed M.D.   On: 12/02/2014 18:42  [4 week]   Impression: 29 year old gentleman with history of untreated HIV, left-sided proctocolitis (noncompliant) who was lost to follow-up back in 2010 presents with  acute renal failure, electrolyte abnormalities in the setting of profuse nonbloody diarrhea for more than one week's duration. Differential diagnosis includes infectious etiology, IBD flare. Given HIV status, opportunistic infections are possibilities well. No need for acute endoscopic evaluation. Re-with a waiting for stool studies. CD4% helper T-cell less than 1, CD4 T cell antibody less than 10.  Patient reports 7 bowel movement since midnight. Hesitant to reinitiate treatment for proctocolitis in the setting of possible infectious etiology.  Plan: 1. Supportive measures. 2. Follow-up pending stool studies. 3. Suggest infectious disease consultation.  We would like to thank you for the opportunity to participate in the care of ZYMERE PATLAN.  Leanna Battles. Dixon Boos [facility] [phone] 8/18/201610:37 AM     LOS: 1 day     Attending note:  Patient seen and examined. Agree with above assessment and recommendations. He needs an ID consultation.

## 2014-12-09 NOTE — Progress Notes (Signed)
[facility] PROGRESS NOTE  Brian Adkins ZOX:[id number] DOB: 08-Sep-1985 DOA: 12/08/2014 PCP: No PCP Per Patient  Assessment/Plan: Acute kidney injury: creatinine within limits of normal this am. Will continue gently IV fluids. Patient reports urine output.  Active Problems: Metabolic acidosis: Related to GI losses secondary to persistent diarrhea. Improving gradually. Continues with frequent stools. Reports 7 stools last night. Continue half-normal saline with 40 of potassium and 1 amp of bicarbonate.  Monitor  Diarrhea: History of ulcerative colitis and proctocolitis. X-ray negative for obstruction but report indicates multiple air-fluid levels in colon compatible with reported diarrhea and could have activated colitis. reports 7 watery stools during night. Stool studies pendng.  Await GI input.    Ulcerative colitis/proctocolitis.: chart review indicates lialda effective in the past. States regular diet induces abdominal cramping and diarrhea. Will change diet to clear liquids.  Appreciate GI assistance.   Hypokalemia: Related to above. repleted and resolved. Will continue to monitor and replete via IV given #2 and #3.    Hyponatremia: Related to #3. Improved this am.  See above therapies   Flank pain: improved this am.  Likely related to #1 in the setting of severe dehydration.    Malnutrition: BMI 22.   Chart review indicates history of HIV. Lost to follow-up. Will obtain HIV antibody, B-12, folate.  Hx HIV positive: CD4 T Cells abs <10. CD$% helper <1. Both values much lower than 6 years ago. Consider infectious disease consult.   Code Status: full Family Communication: none Disposition Plan: home when ready   Consultants:  GI  Procedures:  none  Antibiotics:  None   HPI/Subjective: Sitting up in bed. Reports 7 watery stools last night. Requests clear liquid diet.   Objective: Filed Vitals:   12/09/14 0612  BP: 104/89  Pulse: 89  Temp: 98.7 F  (37.1 C)  Resp: 20    Intake/Output Summary (Last 24 hours) at 12/09/14 0959 Last data filed at 12/08/14 1721  Gross per 24 hour  Intake    237 ml  Output      0 ml  Net    237 ml   Filed Weights   12/08/14 0911 12/08/14 1430  Weight: 54.432 kg (120 lb) 52.028 kg (114 lb 11.2 oz)    Exam:   General:  Appears chronically ill, thin  Cardiovascular: RRR no MGR no LE edema  Respiratory: normal effort BS clear bilaterally no wheeze  Abdomen: flat non-tender sluggish BS no guarding  Musculoskeletal: no clubbing or cyanosis   Data Reviewed: Basic Metabolic Panel:  Recent Labs Lab 12/02/14 1719 12/08/14 0915 12/08/14 1556 12/09/14 0438  NA 131* 126*  --  134*  K 3.8 2.8*  --  3.9  CL 99* 93*  --  110  CO2 21* 18*  --  19*  GLUCOSE 98 117*  --  88  BUN 40* 75*  --  29*  CREATININE 2.00* 1.94*  --  0.85  CALCIUM 9.6 8.8*  --  8.2*  MG  --  1.7 1.6* 2.0   Liver Function Tests:  Recent Labs Lab 12/02/14 1719 12/08/14 0915 12/09/14 0438  AST 48* 44* 38  ALT 39 30 21  ALKPHOS 80 85 63  BILITOT 1.1 1.2 1.0  PROT 10.0* 9.6* 7.2  ALBUMIN 4.9 4.8 3.6    Recent Labs Lab 12/02/14 1719 12/08/14 0915  LIPASE 33 20*   No results for input(s): AMMONIA in the last 168 hours. CBC:  Recent Labs Lab 12/02/14 1719 12/08/14 0915 12/09/14  0438  WBC 5.6 6.8 5.4  NEUTROABS 3.7 5.2  --   HGB 15.4 15.8 12.1*  HCT 42.4 42.3 34.0*  MCV 85.1 82.1 82.5  PLT 237 223 179   Cardiac Enzymes: No results for input(s): CKTOTAL, CKMB, CKMBINDEX, TROPONINI in the last 168 hours. BNP (last 3 results) No results for input(s): BNP in the last 8760 hours.  ProBNP (last 3 results) No results for input(s): PROBNP in the last 8760 hours.  CBG: No results for input(s): GLUCAP in the last 168 hours.  Recent Results (from the past 240 hour(s))  C difficile quick scan w PCR reflex     Status: None   Collection Time: 12/08/14  4:38 PM  Result Value Ref Range Status   C Diff  antigen NEGATIVE NEGATIVE Final   C Diff toxin NEGATIVE NEGATIVE Final   C Diff interpretation Negative for toxigenic C. difficile  Final  MRSA PCR Screening     Status: None   Collection Time: 12/08/14  5:30 PM  Result Value Ref Range Status   MRSA by PCR NEGATIVE NEGATIVE Final    Comment:        The GeneXpert MRSA Assay (FDA approved for NASAL specimens only), is one component of a comprehensive MRSA colonization surveillance program. It is not intended to diagnose MRSA infection nor to guide or monitor treatment for MRSA infections.      Studies: Ct Abdomen Pelvis Wo Contrast  12/08/2014   CLINICAL DATA:  Left lower quadrant and bilateral flank pain. Nausea. History of ulcerative colitis.  EXAM: CT ABDOMEN AND PELVIS WITHOUT CONTRAST  TECHNIQUE: Multidetector CT imaging of the abdomen and pelvis was performed following the standard protocol without IV contrast.  COMPARISON:  None.  FINDINGS: Linear scarring in the right lung base. Left base clear. Heart is normal size. No effusions.  Paucity of intraperitoneal fat and lack of intravenous contrast somewhat limits study. Liver, gallbladder, spleen, pancreas, adrenals and kidneys have an unremarkable unenhanced appearance. No hydronephrosis.  No evidence of bowel obstruction. Stomach, large and small bowel grossly unremarkable. No visible areas of bowel wall thickening. No visible free fluid, free air or adenopathy. Urinary bladder is unremarkable. No acute bony abnormality.  IMPRESSION: Study somewhat limited by lack of intraperitoneal fat and IV contrast. No visible acute findings on this nonenhanced CT.   Electronically Signed   By: Charlett Nose M.D.   On: 12/08/2014 12:35   Dg Chest 2 View  12/08/2014   CLINICAL DATA:  Increasingly worse cough today  EXAM: CHEST  2 VIEW  COMPARISON:  Multiple prior studies including 12/02/2014  FINDINGS: The heart size and mediastinal contours are within normal limits. Both lungs are clear. The  visualized skeletal structures are unremarkable.  IMPRESSION: No active cardiopulmonary disease.   Electronically Signed   By: Esperanza Heir M.D.   On: 12/08/2014 15:48    Scheduled Meds: . feeding supplement (ENSURE ENLIVE)  237 mL Oral BID BM  . heparin  5,000 Units Subcutaneous 3 times per day  . multivitamin with minerals  1 tablet Oral Daily  . pneumococcal 23 valent vaccine  0.5 mL Intramuscular Tomorrow-1000  . sodium chloride  3 mL Intravenous Q12H   Continuous Infusions: . sodium chloride 0.45 % 1,000 mL with potassium chloride 40 mEq, sodium bicarbonate 50 mEq infusion 125 mL/hr at 12/09/14 0251    Principal Problem:   Diarrhea Active Problems:   Ulcerative colitis   AKI (acute kidney injury)   Metabolic acidosis  Hypokalemia   Hyponatremia   Flank pain   Malnutrition    Time spent: 35 mintues    [facility]  [facility] Pager [phone]. If 7PM-7AM, please contact night-coverage at [email or web address], password [facility] 12/09/2014, 9:59 AM  LOS: 1 day

## 2014-12-10 DIAGNOSIS — R1032 Left lower quadrant pain: Secondary | ICD-10-CM

## 2014-12-10 LAB — BASIC METABOLIC PANEL
Anion gap: 7 (ref 5–15)
BUN: 20 mg/dL (ref 6–20)
CHLORIDE: 107 mmol/L (ref 101–111)
CO2: 18 mmol/L — ABNORMAL LOW (ref 22–32)
CREATININE: 0.93 mg/dL (ref 0.61–1.24)
Calcium: 8.8 mg/dL — ABNORMAL LOW (ref 8.9–10.3)
GFR calc Af Amer: >60 mL/min (ref >60)
GFR calc non Af Amer: >60 mL/min (ref >60)
Glucose, Bld: 84 mg/dL (ref 65–99)
Potassium: 4 mmol/L (ref 3.5–5.1)
SODIUM: 132 mmol/L — AB (ref 135–145)

## 2014-12-10 LAB — CBC
HCT: 37.8 % — ABNORMAL LOW (ref 39.0–52.0)
Hemoglobin: 13.6 g/dL (ref 13.0–17.0)
MCH: 30 pg (ref 26.0–34.0)
MCHC: 36 g/dL (ref 30.0–36.0)
MCV: 83.4 fL (ref 78.0–100.0)
PLATELETS: 188 10*3/uL (ref 150–400)
RBC: 4.53 MIL/uL (ref 4.22–5.81)
RDW: 13.8 % (ref 11.5–15.5)
WBC: 5.3 10*3/uL (ref 4.0–10.5)

## 2014-12-10 LAB — HEPATITIS PANEL, ACUTE
HCV AB: 0.1 {s_co_ratio} (ref 0.0–0.9)
HEP A IGM: NEGATIVE
HEP B S AG: NEGATIVE
Hep B C IgM: NEGATIVE

## 2014-12-10 LAB — GI PATHOGEN PANEL BY PCR, STOOL
C difficile toxin A/B: NOT DETECTED
CAMPYLOBACTER BY PCR: NOT DETECTED
E COLI (STEC): NOT DETECTED
E coli (ETEC) LT/ST: NOT DETECTED
E coli 0157 by PCR: NOT DETECTED
G lamblia by PCR: NOT DETECTED
ROTAVIRUS A BY PCR: NOT DETECTED
SALMONELLA BY PCR: NOT DETECTED
SHIGELLA BY PCR: NOT DETECTED

## 2014-12-10 LAB — RPR, QUANT+TP ABS (REFLEX): TREPONEMA PALLIDUM AB: POSITIVE — AB

## 2014-12-10 LAB — MAGNESIUM: Magnesium: 2.1 mg/dL (ref 1.7–2.4)

## 2014-12-10 LAB — RPR: RPR: REACTIVE — AB

## 2014-12-10 MED ORDER — CALCIUM POLYCARBOPHIL 625 MG PO TABS
1250.0000 mg | ORAL_TABLET | Freq: Every day | ORAL | Status: DC
  Administered 2014-12-10 – 2014-12-11 (×2): 1250 mg via ORAL
  Filled 2014-12-10 (×3): qty 2

## 2014-12-10 MED ORDER — BOOST / RESOURCE BREEZE PO LIQD
1.0000 | Freq: Three times a day (TID) | ORAL | Status: DC
  Administered 2014-12-10 – 2014-12-12 (×5): 1 via ORAL

## 2014-12-10 NOTE — Progress Notes (Signed)
During my chart check an order for AFB blood culture was noted.  I contacted M. Lynch who was on for floor coverage to ask if this test was being done to rule out TB being that it was ordered on blood and not sputum to see if the patient needed to be placed in a negative pressure room.  She was unsure and advised me to contact someone in infectious disease.  I contacted Harlow Ohms, RN at [phone] and told her about the patient and asked her if I should place the patient in a negative pressure room and she said the patient did not need to go in a negative pressure room if it is ordered on blood and the patient has no active signs and symptoms of TB.  Patient denies productive cough, night sweats or other signs and symptoms of TB.  The order states AFB blood culture and the attestation from Dr. Sherrie Mustache to the note done by Toya Smothers, NP on 12/09/14 states AFB blood culture recommended by Dr. Orvan Falconer.  Nursing staff to continue to monitor

## 2014-12-10 NOTE — Care Management Note (Signed)
Case Management Note  Patient Details  Name: Brian Adkins MRN: [id number] Date of Birth: [birth year]  Subjective/Objective:                    Action/Plan:   Expected Discharge Date:                  Expected Discharge Plan:  Home/Self Care  In-House Referral:  Financial Counselor  Discharge planning Services  CM Consult, Follow-up appt scheduled  Post Acute Care Choice:  NA Choice offered to:  NA  DME Arranged:    DME Agency:     HH Arranged:    HH Agency:     Status of Service:  Completed, signed off  Medicare Important Message Given:    Date Medicare IM Given:    Medicare IM give by:    Date Additional Medicare IM Given:    Additional Medicare Important Message give by:     If discussed at Long Length of Stay Meetings, dates discussed:    Additional Comments: Anticipate discharge over the weekend. Pt is aware of follow up at [facility] and it has been documented on AVS. No further CM needs noted. Arlyss Queen Aurora, RN 12/10/2014, 1:02 PM

## 2014-12-10 NOTE — Progress Notes (Signed)
Subjective: Feeling better. Continued diarrhea although has slowed substantially, only 3 bowel movements in the past 24 hours per patient. Denies abdominal pain, hematochezia, melena. Tolerating clear liquids well.  Objective: Vital signs in last 24 hours: Temp:  [97.7 F (36.5 C)-98.8 F (37.1 C)] 98.2 F (36.8 C) (08/19 0559) Pulse Rate:  [73-86] 86 (08/19 0559) Resp:  [20] 20 (08/19 0559) BP: (91-99)/(44-60) 91/44 mmHg (08/19 0559) SpO2:  [100 %] 100 % (08/19 0559) Last BM Date: 12/10/14 General:   Alert and oriented, pleasant. Small frame/thin, non-toxic appearing. Head:  Normocephalic and atraumatic. Eyes:  No icterus, sclera clear. Conjuctiva pink.  Heart:  S1, S2 present, no murmurs noted.  Lungs: Clear to auscultation bilaterally, without wheezing, rales, or rhonchi.  Abdomen:  Bowel sounds present, soft, non-tender, non-distended. No HSM or hernias noted. No rebound or guarding. No masses appreciated  Pulses:  Normal pulses noted. Extremities:  Without edema. Neurologic:  Alert and  oriented x4;  grossly normal neurologically. Skin:  Warm and dry, intact without significant lesions.  Psych:  Alert and cooperative. Normal mood and affect.  Intake/Output from previous day: 08/18 0701 - 08/19 0700 In: 2964.8 [P.O.:360; I.V.:2604.8] Out: 155 [Urine:153; Stool:2] Intake/Output this shift: Total I/O In: 120 [P.O.:120] Out: -   Lab Results:  Recent Labs  12/08/14 0915 12/09/14 0438 12/10/14 0655  WBC 6.8 5.4 5.3  HGB 15.8 12.1* 13.6  HCT 42.3 34.0* 37.8*  PLT 223 179 188   BMET  Recent Labs  12/08/14 0915 12/09/14 0438 12/10/14 0655  NA 126* 134* 132*  K 2.8* 3.9 4.0  CL 93* 110 107  CO2 18* 19* 18*  GLUCOSE 117* 88 84  BUN 75* 29* 20  CREATININE 1.94* 0.85 0.93  CALCIUM 8.8* 8.2* 8.8*   LFT  Recent Labs  12/08/14 0915 12/09/14 0438  PROT 9.6* 7.2  ALBUMIN 4.8 3.6  AST 44* 38  ALT 30 21  ALKPHOS 85 63  BILITOT 1.2 1.0   PT/INR No  results for input(s): LABPROT, INR in the last 72 hours. Hepatitis Panel  Recent Labs  12/09/14 0438  HEPBSAG Negative  HCVAB 0.1  HEPAIGM Negative  HEPBIGM Negative    Studies/Results: Dg Chest 2 View  12/08/2014   CLINICAL DATA:  Increasingly worse cough today  EXAM: CHEST  2 VIEW  COMPARISON:  Multiple prior studies including 12/02/2014  FINDINGS: The heart size and mediastinal contours are within normal limits. Both lungs are clear. The visualized skeletal structures are unremarkable.  IMPRESSION: No active cardiopulmonary disease.   Electronically Signed   By: Esperanza Heir M.D.   On: 12/08/2014 15:48    Assessment: 29 year old gentleman with history of untreated HIV, left-sided proctocolitis (noncompliant) who was lost to follow-up back in 2010 presents with acute renal failure, electrolyte abnormalities in the setting of profuse nonbloody diarrhea for more than one week's duration. Differential diagnosis includes infectious etiology, IBD flare. Given HIV status, opportunistic infections are possibilities well. No need for acute endoscopic evaluation. CD4% helper T-cell less than 1, CD4 T cell antibody less than 10.  Diarrhea has improved, continued watery stools without overt abdominal pain or noted GI bleeding. Still awaiting multiple labs.  CDiff negative, Acute hepatitis panel negative, H/H normal/stable, electrolyte status improved, BUN/Cr improved. Pending GI pathogen panel, RPR, Crypto sporidium, stool C&S, Giardia, and O&P. Seems more likely infectious/HIV etiology, cannot exclude colitis flare. Difficult to determine at this time due to multiple issues ongoing.   Plan: 1. Await further lab  results 2. Supportive measures 3. Eventual outpatient GI follow-up 4. Monitor for GI bleed or worsening symptoms.    Wynne Dust, AGNP-C Adult & Gerontological Nurse Practitioner [facility]    LOS: 2 days    12/10/2014, 1:22 PM

## 2014-12-10 NOTE — Progress Notes (Signed)
[facility] PROGRESS NOTE  Brian Adkins WUJ:[id number] DOB: Aug 31, 1985 DOA: 12/08/2014 PCP: No PCP Per Patient    Code Status: Full code Family Communication: Discussed with patient; family not available (patient prefers that family is not told about his clinical history). Disposition Plan: Discharge when clinically appropriate.   Consultants:  Curbside consult with ID, Dr. Orvan Falconer  Gastroenterology  Procedures:  None  Antibiotics:  None  HPI/Subjective: Patient says that he is having less diarrhea, numbering 2 so far today. They are less runny/loose. He has no abdominal pain, nausea, or vomiting.  Objective: Filed Vitals:   12/10/14 0559  BP: 91/44  Pulse: 86  Temp: 98.2 F (36.8 C)  Resp: 20   oxygen saturation 100% on room air.  Intake/Output Summary (Last 24 hours) at 12/10/14 1743 Last data filed at 12/10/14 1509  Gross per 24 hour  Intake 1782.25 ml  Output      5 ml  Net 1777.25 ml   Filed Weights   12/08/14 0911 12/08/14 1430  Weight: 54.432 kg (120 lb) 52.028 kg (114 lb 11.2 oz)    Exam:   General: Small framed 29 year old man sitting up in bed, in no acute distress.   Cardiovascular: S1, S2, no murmurs rubs or gallops.  Respiratory: Decreased breath sound in the bases, otherwise clear.  Abdomen: Positive bowel sounds, soft, nontender, nondistended.  Musculoskeletal/extremities: No pedal edema. No acute hot red joints.  Skin: Several tattoos on his chest and both arms; otherwise unremarkable.   Data Reviewed: Basic Metabolic Panel:  Recent Labs Lab 12/08/14 0915 12/08/14 1556 12/09/14 0438 12/10/14 0655  NA 126*  --  134* 132*  K 2.8*  --  3.9 4.0  CL 93*  --  110 107  CO2 18*  --  19* 18*  GLUCOSE 117*  --  88 84  BUN 75*  --  29* 20  CREATININE 1.94*  --  0.85 0.93  CALCIUM 8.8*  --  8.2* 8.8*  MG 1.7 1.6* 2.0 2.1   Liver Function Tests:  Recent Labs Lab 12/08/14 0915 12/09/14 0438  AST 44* 38  ALT 30 21   ALKPHOS 85 63  BILITOT 1.2 1.0  PROT 9.6* 7.2  ALBUMIN 4.8 3.6    Recent Labs Lab 12/08/14 0915  LIPASE 20*   No results for input(s): AMMONIA in the last 168 hours. CBC:  Recent Labs Lab 12/08/14 0915 12/09/14 0438 12/10/14 0655  WBC 6.8 5.4 5.3  NEUTROABS 5.2  --   --   HGB 15.8 12.1* 13.6  HCT 42.3 34.0* 37.8*  MCV 82.1 82.5 83.4  PLT 223 179 188   Cardiac Enzymes: No results for input(s): CKTOTAL, CKMB, CKMBINDEX, TROPONINI in the last 168 hours. BNP (last 3 results) No results for input(s): BNP in the last 8760 hours.  ProBNP (last 3 results) No results for input(s): PROBNP in the last 8760 hours.  CBG: No results for input(s): GLUCAP in the last 168 hours.  Recent Results (from the past 240 hour(s))  C difficile quick scan w PCR reflex     Status: None   Collection Time: 12/08/14  4:38 PM  Result Value Ref Range Status   C Diff antigen NEGATIVE NEGATIVE Final   C Diff toxin NEGATIVE NEGATIVE Final   C Diff interpretation Negative for toxigenic C. difficile  Final  MRSA PCR Screening     Status: None   Collection Time: 12/08/14  5:30 PM  Result Value Ref Range Status   MRSA  by PCR NEGATIVE NEGATIVE Final    Comment:        The GeneXpert MRSA Assay (FDA approved for NASAL specimens only), is one component of a comprehensive MRSA colonization surveillance program. It is not intended to diagnose MRSA infection nor to guide or monitor treatment for MRSA infections.      Studies: No results found.  Scheduled Meds: . feeding supplement  1 Container Oral TID BM  . feeding supplement (ENSURE ENLIVE)  237 mL Oral BID BM  . fluconazole  100 mg Oral Weekly  . heparin  5,000 Units Subcutaneous 3 times per day  . multivitamin with minerals  1 tablet Oral Daily  . polycarbophil  1,250 mg Oral QAC supper  . sodium chloride  3 mL Intravenous Q12H  . sulfamethoxazole-trimethoprim  1 tablet Oral Daily   Continuous Infusions: . sodium chloride 0.45 %  1,000 mL with potassium chloride 40 mEq, sodium bicarbonate 50 mEq infusion 90 mL/hr at 12/10/14 0610   Assessment and plan:  Principal Problem:   Diarrhea Active Problems:   Ulcerative colitis   AKI (acute kidney injury)   Metabolic acidosis   AIDS   Hypokalemia   Hyponatremia   Flank pain   Malnutrition   Human immunodeficiency virus (HIV) disease   LLQ pain   1. Diarrheal illness in a patient with known ulcerated colitis. The patient has a history of ulcerative colitis and proctocolitis, lost to follow-up. CT scan of his abdomen and pelvis on admission revealed no visible acute findings on noncontrasted CT. His AST was marginally elevated, but his lipase was within normal limits. Patient was started on IV fluids for repletion of volume losses. Initial stool studies ordered revealed a negative C. difficile PCR. Gastroenterology was consulted. They were hesitant to reinitiate treatment for proctocolitis in the setting of possible infectious etiology. They recommended following up on the stool studies and treating the patient supportively. -ID was consulted via phone call. Dr. Orvan Falconer recommended ordering a number of additional studies, but he did not recommend starting empiric treatment for diarrhea. -Giardia/crypto, Cryptosporidium smear, stool culture, O&P, GI pathogen panel were ordered and are pending. -The patient's diarrhea has subsided, but has not completely resolved. -We'll continue supportive treatment and gentle IV fluids. -GI advanced his diet.  HIV disease/AIDS. Patient was diagnosed with HIV positivity in 2010, but was lost to follow-up. CD4 count was ordered and was less than 10, indicating diagnosis of AIDS. Acute hepatitis panel was ordered and was negative. RPR was ordered and is pending. Dr. Orvan Falconer was consulted remotely. He recommended ordering a number of studies including HIV viral load and studies as noted above. -Dr. Orvan Falconer did not recommend starting  anti-retroviral medication. However, he did recommend prophylactic Bactrim daily and once weekly fluconazole. -Patient was informed of his CD4 count and recommendations by ID. I informed him that because of his low CD4 count, he has a diagnosis of AIDS. He was advised and encouraged to follow-up with ID following his acute diarrheal illness. He was educated that HIV is no longer a death sentence, but can be a chronic disease treated with effective medications. He was advised to use protection while having sexual intercourse.  Acute kidney injury, secondary to hypovolemia, prerenal azotemia. The patient's creatinine was 1.94 on admission. He was started on IV fluid hydration. Following IV fluids, his creatinine/renal function has normalized with a creatinine of 0.93. -Will decrease IV fluids some.  Hypokalemia. The patient's serum potassium was 2.8 on admission, likely from watery diarrhea.  He was started on oral and IV supplementation. His magnesium level was borderline low and therefore he was repleted with IV magnesium sulfate. -His serum potassium has improved and is 4.0.  Hyponatremia. Patient serum sodium was 126 on admission, presumed to be hypovolemic hyponatremia from watery diarrhea. He was started on IV fluids with saline/bicarbonate. His serum sodium has improved. -We'll continue gentle IV fluids.  Metabolic acidosis. The patient CO2 was 18 on admission. He was started on IV fluids with bicarbonate added. His CO2 has improved a little.    Time spent: 25 minutes    [facility]  [facility] Pager [phone]. If 7PM-7AM, please contact night-coverage at [email or web address], password [facility] 12/10/2014, 5:43 PM  LOS: 2 days

## 2014-12-11 ENCOUNTER — Encounter (HOSPITAL_COMMUNITY): Payer: Self-pay | Admitting: Internal Medicine

## 2014-12-11 DIAGNOSIS — A072 Cryptosporidiosis: Secondary | ICD-10-CM

## 2014-12-11 DIAGNOSIS — A0811 Acute gastroenteropathy due to Norwalk agent: Secondary | ICD-10-CM

## 2014-12-11 HISTORY — DX: Acute gastroenteropathy due to Norwalk agent: A08.11

## 2014-12-11 HISTORY — DX: Cryptosporidiosis: A07.2

## 2014-12-11 MED ORDER — PENICILLIN G BENZATHINE 1200000 UNIT/2ML IM SUSP
2.4000 10*6.[IU] | Freq: Once | INTRAMUSCULAR | Status: AC
  Administered 2014-12-11: 2.4 10*6.[IU] via INTRAMUSCULAR
  Filled 2014-12-11: qty 4

## 2014-12-11 NOTE — Progress Notes (Addendum)
Patient ID: Brian Adkins, male   DOB: Jan 24, 1986, 29 y.o.   MRN: [id number]   Assessment/Plan: ADMITTED WITH PROFUSE WATERY DIARRHEA. EVALUATION FOR INFECTIOUS ETIOLOGY-positive for Cryptosporidium and NORAVIRUS. CLINICALLY IMPROVED.  PLAN: 1.CONTINUE TO MONITOR SYMPTOMS. 2. LOW RESIDUE DIET AUG 21   Subjective: Since I last evaluated the patient he continues to have loose stools. DENIES WATERY STOOL. C/O BACK AND ABDOMINALPAIN.  APPETITE FAIR. TOLERATING POS.  Objective: Vital signs in last 24 hours: Filed Vitals:   12/11/14 0801  BP: 86/58  Pulse:   Temp: 98.6 F (37 C)  Resp: 16     General appearance: alert, cooperative and no distress Resp: clear to auscultation bilaterally Cardio: regular rate and rhythm GI: soft, non-tender; bowel sounds normal; no masses,  no organomegaly  Lab Results:  NONE   Studies/Results: No results found.  Medications: I have reviewed the patient's current medications.   LOS: 5 days   Jonette Eva 10/01/2013, 2:23 PM

## 2014-12-11 NOTE — Progress Notes (Signed)
[facility] PROGRESS NOTE  Brian Adkins MWU:[id number] DOB: 1985/08/12 DOA: 12/08/2014 PCP: No PCP Per Patient    Code Status: Full code Family Communication: Discussed with patient; family not available (patient prefers that family is not told about his clinical history). Disposition Plan: Discharge when clinically appropriate.   Consultants:  Curbside consult with ID, Dr. Orvan Falconer  Gastroenterology  Procedures:  None  Antibiotics:  None  HPI/Subjective: Patient says that he is still having loose stools, numbering 3 so far today. His appetite has been fair. He has occasional intermittent lower crampy abdominal pain. He denies nausea vomiting.  Objective: Filed Vitals:   12/11/14 1449  BP: 107/68  Pulse: 78  Temp: 98 F (36.7 C)  Resp: 20   oxygen saturation 100% on room air.  Intake/Output Summary (Last 24 hours) at 12/11/14 1736 Last data filed at 12/11/14 1300  Gross per 24 hour  Intake 2431.67 ml  Output      0 ml  Net 2431.67 ml   Filed Weights   12/08/14 0911 12/08/14 1430  Weight: 54.432 kg (120 lb) 52.028 kg (114 lb 11.2 oz)    Exam:   General: Small framed 29 year old man sitting up in bed, in no acute distress.   Cardiovascular: S1, S2, no murmurs rubs or gallops.  Respiratory: Decreased breath sound in the bases, otherwise clear.  Abdomen: Positive bowel sounds, soft, mild tenderness hypogastrium, nondistended.  Musculoskeletal/extremities: No pedal edema. No acute hot red joints.  Skin: Several tattoos on his chest and both arms; otherwise unremarkable.   Data Reviewed: Basic Metabolic Panel:  Recent Labs Lab 12/08/14 0915 12/08/14 1556 12/09/14 0438 12/10/14 0655  NA 126*  --  134* 132*  K 2.8*  --  3.9 4.0  CL 93*  --  110 107  CO2 18*  --  19* 18*  GLUCOSE 117*  --  88 84  BUN 75*  --  29* 20  CREATININE 1.94*  --  0.85 0.93  CALCIUM 8.8*  --  8.2* 8.8*  MG 1.7 1.6* 2.0 2.1   Liver Function Tests:  Recent  Labs Lab 12/08/14 0915 12/09/14 0438  AST 44* 38  ALT 30 21  ALKPHOS 85 63  BILITOT 1.2 1.0  PROT 9.6* 7.2  ALBUMIN 4.8 3.6    Recent Labs Lab 12/08/14 0915  LIPASE 20*   No results for input(s): AMMONIA in the last 168 hours. CBC:  Recent Labs Lab 12/08/14 0915 12/09/14 0438 12/10/14 0655  WBC 6.8 5.4 5.3  NEUTROABS 5.2  --   --   HGB 15.8 12.1* 13.6  HCT 42.3 34.0* 37.8*  MCV 82.1 82.5 83.4  PLT 223 179 188   Cardiac Enzymes: No results for input(s): CKTOTAL, CKMB, CKMBINDEX, TROPONINI in the last 168 hours. BNP (last 3 results) No results for input(s): BNP in the last 8760 hours.  ProBNP (last 3 results) No results for input(s): PROBNP in the last 8760 hours.  CBG: No results for input(s): GLUCAP in the last 168 hours.  Recent Results (from the past 240 hour(s))  C difficile quick scan w PCR reflex     Status: None   Collection Time: 12/08/14  4:38 PM  Result Value Ref Range Status   C Diff antigen NEGATIVE NEGATIVE Final   C Diff toxin NEGATIVE NEGATIVE Final   C Diff interpretation Negative for toxigenic C. difficile  Final  MRSA PCR Screening     Status: None   Collection Time: 12/08/14  5:30 PM  Result Value Ref Range Status   MRSA by PCR NEGATIVE NEGATIVE Final    Comment:        The GeneXpert MRSA Assay (FDA approved for NASAL specimens only), is one component of a comprehensive MRSA colonization surveillance program. It is not intended to diagnose MRSA infection nor to guide or monitor treatment for MRSA infections.      Studies: No results found.  Scheduled Meds: . feeding supplement  1 Container Oral TID BM  . feeding supplement (ENSURE ENLIVE)  237 mL Oral BID BM  . fluconazole  100 mg Oral Weekly  . heparin  5,000 Units Subcutaneous 3 times per day  . multivitamin with minerals  1 tablet Oral Daily  . polycarbophil  1,250 mg Oral QAC supper  . sodium chloride  3 mL Intravenous Q12H  . sulfamethoxazole-trimethoprim  1 tablet  Oral Daily   Continuous Infusions: . sodium chloride 0.45 % 1,000 mL with potassium chloride 40 mEq, sodium bicarbonate 50 mEq infusion 125 mL/hr at 12/11/14 1212   Assessment and plan:  Principal Problem:   Diarrhea Active Problems:   Ulcerative colitis   AKI (acute kidney injury)   Metabolic acidosis   AIDS   Hypokalemia   Hyponatremia   Flank pain   Malnutrition   Human immunodeficiency virus (HIV) disease   LLQ pain   1. Diarrheal illness in a patient with known ulcerated colitis. Cryptosporidium positive, neural virus positive. The patient has a history of ulcerative colitis and proctocolitis, lost to follow-up. CT scan of his abdomen and pelvis on admission revealed no visible acute findings on noncontrasted CT. His AST was marginally elevated, but his lipase was within normal limits. Patient was started on IV fluids for repletion of volume losses. Initial stool studies ordered revealed a negative C. difficile PCR. Gastroenterology was consulted. They were hesitant to reinitiate treatment for proctocolitis in the setting of possible infectious etiology. They recommended following up on the stool studies and treating the patient supportively. -ID was consulted via phone call. Dr. Orvan Falconer recommended ordering a number of additional studies, but he did not recommend starting empiric treatment for diarrhea. -GI panel pathogen was positive for cryptosporidium and Norovirus. Discussed these findings with ID, Dr. Orvan Falconer on 12/11/14. He recommended supportive treatment. -Giardia and O&P results pending. -The patient's diarrhea has subsided, but has not completely resolved. -We'll continue supportive treatment and gentle IV fluids. -GI advance his diet which he has tolerating on a fair basis.  HIV disease/AIDS. Patient was diagnosed with HIV positivity in 2010, but was lost to follow-up. CD4 count was ordered and was less than 10, indicating diagnosis of AIDS. Acute hepatitis panel  was ordered and was negative. RPR was ordered and was positive. Dr. Orvan Falconer was consulted remotely. He recommended ordering a number of studies including HIV viral load and studies as noted above. -Dr. Orvan Falconer did not recommend starting anti-retroviral medication. However, he did recommend prophylactic Bactrim daily and once weekly fluconazole. -Patient was informed of his CD4 count and recommendations by ID. I informed him that because of his low CD4 count, he has a diagnosis of AIDS. He was advised and encouraged to follow-up with ID following his acute diarrheal illness. He was educated that HIV is no longer a death sentence, but can be a chronic disease treated with effective medications. He was advised to use protection while having sexual intercourse.  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 He reported being treated many years ago. -This  was discussed with Dr. Orvan Falconer. He recommended giving the patient another IM dose of penicillin given that the patient was not completely reliable and had failed to follow-up.  Acute kidney injury, secondary to hypovolemia, prerenal azotemia. The patient's creatinine was 1.94 on admission. He was started on IV fluid hydration. Following IV fluids, his creatinine/renal function has normalized with a creatinine of 0.93. -Because of his low-normal blood pressures, the IV fluids were increased again. We'll continue to monitor.  Hypokalemia. The patient's serum potassium was 2.8 on admission, likely from watery diarrhea. He was started on oral and IV supplementation. His magnesium level was borderline low and therefore he was repleted with IV magnesium sulfate. -His serum potassium has improved and is 4.0.  Hyponatremia. Patient serum sodium was 126 on admission, presumed to be hypovolemic hyponatremia from watery diarrhea. He was started on IV fluids with saline/bicarbonate. His serum sodium has improved. -We'll  continue gentle IV fluids.  Metabolic acidosis. The patient CO2 was 18 on admission. He was started on IV fluids with bicarbonate added. His CO2 has improved a little.    Time spent: 25 minutes    [facility]  [facility] Pager [phone]. If 7PM-7AM, please contact night-coverage at [email or web address], password [facility] 12/11/2014, 5:36 PM  LOS: 3 days

## 2014-12-12 ENCOUNTER — Encounter (HOSPITAL_COMMUNITY): Payer: Self-pay | Admitting: Internal Medicine

## 2014-12-12 DIAGNOSIS — A072 Cryptosporidiosis: Principal | ICD-10-CM

## 2014-12-12 DIAGNOSIS — A0811 Acute gastroenteropathy due to Norwalk agent: Secondary | ICD-10-CM

## 2014-12-12 DIAGNOSIS — A539 Syphilis, unspecified: Secondary | ICD-10-CM

## 2014-12-12 DIAGNOSIS — K51919 Ulcerative colitis, unspecified with unspecified complications: Secondary | ICD-10-CM

## 2014-12-12 HISTORY — DX: Syphilis, unspecified: A53.9

## 2014-12-12 LAB — BASIC METABOLIC PANEL
Anion gap: 7 (ref 5–15)
BUN: 23 mg/dL — AB (ref 6–20)
CALCIUM: 9.2 mg/dL (ref 8.9–10.3)
CO2: 19 mmol/L — AB (ref 22–32)
CREATININE: 0.96 mg/dL (ref 0.61–1.24)
Chloride: 104 mmol/L (ref 101–111)
GFR calc Af Amer: >60 mL/min (ref >60)
GLUCOSE: 88 mg/dL (ref 65–99)
Potassium: 4.7 mmol/L (ref 3.5–5.1)
Sodium: 130 mmol/L — ABNORMAL LOW (ref 135–145)

## 2014-12-12 LAB — CBC
HCT: 38.3 % — ABNORMAL LOW (ref 39.0–52.0)
HEMOGLOBIN: 14.1 g/dL (ref 13.0–17.0)
MCH: 30.7 pg (ref 26.0–34.0)
MCHC: 36.8 g/dL — AB (ref 30.0–36.0)
MCV: 83.3 fL (ref 78.0–100.0)
PLATELETS: 233 10*3/uL (ref 150–400)
RBC: 4.6 MIL/uL (ref 4.22–5.81)
RDW: 14.2 % (ref 11.5–15.5)
WBC: 5.6 10*3/uL (ref 4.0–10.5)

## 2014-12-12 LAB — HIV-1 RNA QUANT-NO REFLEX-BLD
HIV 1 RNA Quant: 75420 copies/mL
LOG10 HIV-1 RNA: 4.877 {Log_copies}/mL

## 2014-12-12 MED ORDER — FLUCONAZOLE 100 MG PO TABS: 100.0000 mg | ORAL_TABLET | ORAL | Status: DC

## 2014-12-12 MED ORDER — SULFAMETHOXAZOLE-TRIMETHOPRIM 800-160 MG PO TABS: 1.0000 | ORAL_TABLET | Freq: Every day | ORAL | Status: DC

## 2014-12-12 NOTE — Discharge Summary (Signed)
Physician Discharge Summary  Brian Adkins NWG:[id number] DOB: 25-Mar-1986 DOA: 12/08/2014  PCP: No PCP Per Patient  Admit date: 12/08/2014 Discharge date: 12/12/2014  Time spent: Greater than 30 minutes  Recommendations for Outpatient Follow-up:  1. Patient was instructed to call the ID clinic to speak with Brian Adkins for scheduling a follow-up appointment with the infectious diseases physician. 2. He was instructed not to go back to work at [employer] due to his diarrheal infections. He was informed that he could go back to work once cleared with infectious diseases physician.  3. Labs pending at the time of discharge include HIV viral load, stool culture, O&P exam, Giardia/crypto exam, and AFB blood culture.  Discharge Diagnoses:  1. Diarrheal infection/illness, secondary Norovirus and cryptosporidium. 2. History of proctoulcerative colitis 3. HIV/AIDS, diagnosed 2010, but was lost to follow-up. -CD4 count during this admission was less than 10. -HIV viral load was pending at discharge. 4. Syphilis, with RPR and T Palladium positive. 5. Hypokalemia secondary to diarrhea. 6. Hyponatremia secondary to hypovolemia; secondarily to hypotonic fluids. 7. Metabolic acidosis secondary to diarrhea. 8. Acute kidney injury secondary to prerenal azotemia. 9. Severe protein calorie malnutrition.  Discharge Condition: Improved  Diet recommendation: Regular/load residue as tolerated.  Filed Weights   12/08/14 0911 12/08/14 1430  Weight: 54.432 kg (120 lb) 52.028 kg (114 lb 11.2 oz)    History of present illness:  Patient is a 29 year old man with a history of ulcerated colitis who was lost to follow-up. He also has a history of HIV positivity in 2010 for which he failed to follow-up with ID. He presented to the emergency department on 12/08/2014 with a chief complaint of persistent watery diarrhea. In the ED, he was found to be hyponatremic, hypovolemic, hypokalemic, and with metabolic acidosis. His lipase was  within normal limits. His AST was marginally elevated at 44. His BUN was 75 and his creatinine was 1.94. CT scan of his abdomen and pelvis revealed no acute findings, but there was the presence of multiple air-fluid levels in the colon compatible with his history of diarrhea. His urinalysis was unremarkable. He was admitted for further evaluation and management.  Hospital Course:   1. Diarrheal illness in a patient with known ulcerated colitis. Stool Cryptosporidium positive, Norovirus positive. The patient has a history of ulcerative colitis and proctocolitis, lost to follow-up. CT scan of his abdomen and pelvis on admission revealed no visible acute findings on noncontrasted CT. His AST was marginally elevated, but his lipase was within normal limits. Patient was started on IV fluids for repletion of GI losses. Initial stool studies ordered revealed a negative C. difficile PCR. Gastroenterology was consulted. They were hesitant to reinitiate treatment for proctocolitis in the setting of a possible infectious etiology. They recommended following up on the stool studies and treating the patient supportively. -ID was consulted via phone call. Dr. Orvan Adkins recommended ordering a number of additional studies, but he did not recommend starting empiric treatment for diarrhea. -GI panel pathogen was positive for cryptosporidium and Norovirus. These findings were discussed with ID, Dr. Orvan Adkins on 12/11/14. He recommended supportive treatment. -Acute bowel hepatitis panel was negative. -Giardia, O&P, and stool culture results were pending at the time of discharge. -The patient's diarrhea had decreased from 7-10 watery bowel movements a day, to 2-3 semi-formed bowel movements per day. -He works at [employer] He was instructed not to return to work until he was cleared by the ID physicians at [facility]. He voiced understanding. HIV disease/AIDS. Patient was diagnosed with  HIV positivity in 2010, but was lost to  follow-up. CD4 count was ordered and was less than 10, indicating diagnosis of AIDS. Acute hepatitis panel was ordered and was negative. RPR was ordered and was positive. Dr. Orvan Adkins was consulted remotely. He recommended ordering additional studies including HIV viral load and AFB blood culture. -Dr. Orvan Adkins did not recommend starting anti-retroviral medication. However, he did recommend prophylactic Bactrim daily and once weekly fluconazole, which were ordered and prescribed at the time of discharge. -Patient was informed of his CD4 count and recommendations by ID. I informed him that because of his low CD4 count, he had a diagnosis of AIDS. He was advised and encouraged to follow-up with ID following his acute diarrheal illness. He was educated that HIV was no longer a death sentence, but can be a chronic disease treated with effective medications. He was advised to use protection while having sexual intercourse. -HIV viral load and AFB blood culture were pending at the time of discharge. RPR positive/T Palladium positive. Titer 1:16 Patient's RPR and to Palladium was positive.Marland Kitchen He reported being treated many years ago with penicillin injection 1. -This was discussed with Dr. Orvan Adkins. He recommended giving the patient another IM dose of penicillin essentially because the patient was not completely reliable and had failed to follow-up. -The patient was given one IM injection of penicillin as recommended. Acute kidney injury, secondary to hypovolemia, prerenal azotemia. The patient's creatinine was 1.94 on admission. He was started on IV fluid hydration. Following IV fluids, his creatinine/renal function has normalized with a creatinine of 0.96. Hypokalemia. The patient's serum potassium was 2.8 on admission, likely from watery diarrhea. He was started on oral and IV supplementation. His magnesium level was borderline low and therefore he was repleted with IV magnesium sulfate. -His serum  potassium improved and was 4.7 at the time of discharge. Hyponatremia. Patient serum sodium was 126 on admission, presumed to be hypovolemic hyponatremia from watery diarrhea. He was started on IV fluids with saline/bicarbonate. His serum sodium has improved, but decreased a little to 130 prior to discharge. The decrease may have been secondary to the hypotonic fluids. -Would recommend follow-up monitoring in the outpatient setting. Metabolic acidosis. The patient CO2 was 18 on admission. He was started on IV fluids with bicarbonate added. His CO2 improved to 19 before discharge.    Procedures:  None  Consultations:  ID, Dr. Orvan Adkins  Gastroenterology, Dr. Darrick Penna  Discharge Exam: Filed Vitals:   12/12/14 0401  BP: 105/66  Pulse: 89  Temp: 98.3 F (36.8 C)  Resp: 20     General: Small framed 29 year old man sitting up in bed, in no acute distress.   Cardiovascular: S1, S2, no murmurs rubs or gallops.  Respiratory: Decreased breath sound in the bases, otherwise clear.  Abdomen: Positive bowel sounds, soft, nontender, nondistended  Musculoskeletal/extremities: No pedal edema. No acute hot red joints.  Skin: Several tattoos on his chest and both arms; otherwise unremarkable.  Discharge Instructions   Discharge Instructions    Diet general    Complete by:  As directed      Discharge instructions    Complete by:  As directed   Take medications as prescribed and follow up with your doctors.     Increase activity slowly    Complete by:  As directed           Current Discharge Medication List    START taking these medications   Details  fluconazole (DIFLUCAN) 100 MG tablet Take 1  tablet (100 mg total) by mouth once a week. Qty: 4 tablet, Refills: 3    sulfamethoxazole-trimethoprim (BACTRIM DS,SEPTRA DS) 800-160 MG per tablet Take 1 tablet by mouth daily. Qty: 30 tablet, Refills: 3      CONTINUE these medications which have NOT CHANGED   Details  B Complex  Vitamins (VITAMIN B COMPLEX PO) Take by mouth.    Multiple Vitamin (MULTIVITAMIN WITH MINERALS) TABS tablet Take 1 tablet by mouth daily.    traMADol (ULTRAM) 50 MG tablet Take 1 tablet (50 mg total) by mouth every 6 (six) hours as needed. Qty: 15 tablet, Refills: 0      STOP taking these medications     BIOTIN PO      bismuth subsalicylate (PEPTO BISMOL) 262 MG chewable tablet      Cyanocobalamin (VITAMIN B-12 PO)      ondansetron (ZOFRAN ODT) 4 MG disintegrating tablet        No Known Allergies Follow-up Information    Follow up with [facility] On 12/22/2014.   Specialty:  Occupational Therapy   Why:  at 1:00   Contact information:   [address] [place] [address] [phone]       Schedule an appointment as soon as possible for a visit with Cliffton Asters, MD.   Specialty:  Infectious Diseases   Why:  Call Samule Ohm at [phone] to make an appointment with the ID doctor   Contact information:   [address] [place] [address] [phone]       Follow up with Jonette Eva, MD. Schedule an appointment as soon as possible for a visit in 4 weeks.   Specialty:  Gastroenterology   Why:  To follow up on your ulcerative colitis   Contact information:   [address] [place] [address] [phone]        The results of significant diagnostics from this hospitalization (including imaging, microbiology, ancillary and laboratory) are listed below for reference.    Significant Diagnostic Studies: Ct Abdomen Pelvis Wo Contrast  12/08/2014   CLINICAL DATA:  Left lower quadrant and bilateral flank pain. Nausea. History of ulcerative colitis.  EXAM: CT ABDOMEN AND PELVIS WITHOUT CONTRAST  TECHNIQUE: Multidetector CT imaging of the abdomen and pelvis was performed following the standard protocol without IV contrast.  COMPARISON:  None.  FINDINGS: Linear scarring in the right lung base. Left  base clear. Heart is normal size. No effusions.  Paucity of intraperitoneal fat and lack of intravenous contrast somewhat limits study. Liver, gallbladder, spleen, pancreas, adrenals and kidneys have an unremarkable unenhanced appearance. No hydronephrosis.  No evidence of bowel obstruction. Stomach, large and small bowel grossly unremarkable. No visible areas of bowel wall thickening. No visible free fluid, free air or adenopathy. Urinary bladder is unremarkable. No acute bony abnormality.  IMPRESSION: Study somewhat limited by lack of intraperitoneal fat and IV contrast. No visible acute findings on this nonenhanced CT.   Electronically Signed   By: Charlett Nose M.D.   On: 12/08/2014 12:35   Dg Chest 2 View  12/08/2014   CLINICAL DATA:  Increasingly worse cough today  EXAM: CHEST  2 VIEW  COMPARISON:  Multiple prior studies including 12/02/2014  FINDINGS: The heart size and mediastinal contours are within normal limits. Both lungs are clear. The visualized skeletal structures are unremarkable.  IMPRESSION: No active cardiopulmonary disease.   Electronically Signed   By: Marcy Salvo  Rubner M.D.   On: 12/08/2014 15:48   Dg Abd Acute W/chest  12/02/2014   CLINICAL DATA:  29 year old male with diarrhea, nausea with increase p.o. intake and mid abdominal pain cramping for the past 7 days. History of ulcerative colitis.  EXAM: DG ABDOMEN ACUTE W/ 1V CHEST  COMPARISON:  Chest x-ray 01/09/2014.  FINDINGS: Mild scarring in the periphery of the right lung base, unchanged compared to the prior examination. Lung volumes are normal. No consolidative airspace disease. No pleural effusions. No pneumothorax. No pulmonary nodule or mass noted. Pulmonary vasculature and the cardiomediastinal silhouette are within normal limits.  Air-fluid levels are present in the colon. No pathologic dilatation of small bowel or colon. No pneumoperitoneum. Calcifications projecting over the upper abdomen are favored to be pancreatic. High  density material in the right lower quadrant maybe postoperative, particularly if there is prior history of partial bowel resection.  IMPRESSION: 1. Nonspecific bowel gas pattern, as above. While findings do not indicate bowel obstruction, the presence of multiple air-fluid levels in the colon is compatible with the reported history of diarrhea, and could indicate active colitis. 2. No pneumoperitoneum. 3. No radiographic evidence of acute cardiopulmonary disease.   Electronically Signed   By: Trudie Reed M.D.   On: 12/02/2014 18:42    Microbiology: Recent Results (from the past 240 hour(s))  C difficile quick scan w PCR reflex     Status: None   Collection Time: 12/08/14  4:38 PM  Result Value Ref Range Status   C Diff antigen NEGATIVE NEGATIVE Final   C Diff toxin NEGATIVE NEGATIVE Final   C Diff interpretation Negative for toxigenic C. difficile  Final  MRSA PCR Screening     Status: None   Collection Time: 12/08/14  5:30 PM  Result Value Ref Range Status   MRSA by PCR NEGATIVE NEGATIVE Final    Comment:        The GeneXpert MRSA Assay (FDA approved for NASAL specimens only), is one component of a comprehensive MRSA colonization surveillance program. It is not intended to diagnose MRSA infection nor to guide or monitor treatment for MRSA infections.   Stool culture     Status: None (Preliminary result)   Collection Time: 12/09/14 11:30 PM  Result Value Ref Range Status   Specimen Description STOOL  Final   Special Requests NONE  Final   Culture   Final    Culture reincubated for better growth Performed at [facility]    Report Status PENDING  Incomplete     Labs: Basic Metabolic Panel:  Recent Labs Lab 12/08/14 0915 12/08/14 1556 12/09/14 0438 12/10/14 0655 12/12/14 0604  NA 126*  --  134* 132* 130*  K 2.8*  --  3.9 4.0 4.7  CL 93*  --  110 107 104  CO2 18*  --  19* 18* 19*  GLUCOSE 117*  --  88 84 88  BUN 75*  --  29* 20 23*  CREATININE 1.94*   --  0.85 0.93 0.96  CALCIUM 8.8*  --  8.2* 8.8* 9.2  MG 1.7 1.6* 2.0 2.1  --    Liver Function Tests:  Recent Labs Lab 12/08/14 0915 12/09/14 0438  AST 44* 38  ALT 30 21  ALKPHOS 85 63  BILITOT 1.2 1.0  PROT 9.6* 7.2  ALBUMIN 4.8 3.6    Recent Labs Lab 12/08/14 0915  LIPASE 20*   No results for input(s): AMMONIA in the last 168 hours. CBC:  Recent Labs  Lab 12/08/14 0915 12/09/14 0438 12/10/14 0655 12/12/14 0604  WBC 6.8 5.4 5.3 5.6  NEUTROABS 5.2  --   --   --   HGB 15.8 12.1* 13.6 14.1  HCT 42.3 34.0* 37.8* 38.3*  MCV 82.1 82.5 83.4 83.3  PLT 223 179 188 233   Cardiac Enzymes: No results for input(s): CKTOTAL, CKMB, CKMBINDEX, TROPONINI in the last 168 hours. BNP: BNP (last 3 results) No results for input(s): BNP in the last 8760 hours.  ProBNP (last 3 results) No results for input(s): PROBNP in the last 8760 hours.  CBG: No results for input(s): GLUCAP in the last 168 hours.     Signed:  Deneisha Dade  [facility] 12/12/2014, 10:17 AM

## 2014-12-12 NOTE — Progress Notes (Signed)
ED/CM noted patient did not have health insurance and/or PCP listed in the computer.  Patient was given the [facility] resources handout with information on the clinics, food pantries, and the handout for new health insurance sign-up. Provided drug discount card. Also provided number to [employer].  Patient decided not to use MATCH program at this time for medications.  Patient expressed appreciation for information received.

## 2014-12-12 NOTE — Progress Notes (Signed)
Alert and oriented. Vital signs stable. Saline lock removed. Telemetry D/C'ed. Discharge instructions given. Prescriptions given. Patient verbalized understanding of instructions. Left floor via wheelchair with nursing staff.  Discharged home.  Pt instructed to return to work only after being cleared by Infectious Disease.  Heather Roberts 12/12/2014 7:43 PM

## 2014-12-13 ENCOUNTER — Telehealth (HOSPITAL_COMMUNITY): Payer: Self-pay

## 2014-12-14 LAB — GIARDIA/CRYPTOSPORIDIUM SCREEN(EIA)
Cryptosporidium Screen (EIA): POSITIVE
GIARDIA SCREEN - EIA: NEGATIVE

## 2014-12-14 LAB — OVA AND PARASITE EXAMINATION

## 2014-12-14 LAB — STOOL CULTURE

## 2014-12-14 LAB — CRYPTOSPORIDIUM SMEAR, FECAL

## 2014-12-22 ENCOUNTER — Encounter: Payer: Self-pay | Admitting: Infectious Disease

## 2014-12-22 ENCOUNTER — Other Ambulatory Visit: Payer: Self-pay | Admitting: *Deleted

## 2014-12-22 ENCOUNTER — Encounter (HOSPITAL_COMMUNITY): Payer: Self-pay | Admitting: General Practice

## 2014-12-22 ENCOUNTER — Inpatient Hospital Stay (HOSPITAL_COMMUNITY)
Admission: AD | Admit: 2014-12-22 | Discharge: 2015-01-01 | DRG: 977 | Disposition: A | Discharge status: Home Health | Payer: Medicaid Other | Source: Ambulatory Visit | Attending: Internal Medicine | Admitting: Internal Medicine

## 2014-12-22 ENCOUNTER — Ambulatory Visit (INDEPENDENT_AMBULATORY_CARE_PROVIDER_SITE_OTHER): Payer: Self-pay | Admitting: Infectious Disease

## 2014-12-22 VITALS — BP 86/68 | HR 116 | Temp 97.7°F | Wt 94.0 lb

## 2014-12-22 DIAGNOSIS — A0819 Acute gastroenteropathy due to other small round viruses: Secondary | ICD-10-CM | POA: Diagnosis present

## 2014-12-22 DIAGNOSIS — B20 Human immunodeficiency virus [HIV] disease: Secondary | ICD-10-CM

## 2014-12-22 DIAGNOSIS — R112 Nausea with vomiting, unspecified: Secondary | ICD-10-CM | POA: Diagnosis present

## 2014-12-22 DIAGNOSIS — K5190 Ulcerative colitis, unspecified, without complications: Secondary | ICD-10-CM | POA: Diagnosis present

## 2014-12-22 DIAGNOSIS — A0811 Acute gastroenteropathy due to Norwalk agent: Secondary | ICD-10-CM

## 2014-12-22 DIAGNOSIS — E876 Hypokalemia: Secondary | ICD-10-CM

## 2014-12-22 DIAGNOSIS — R197 Diarrhea, unspecified: Secondary | ICD-10-CM | POA: Diagnosis present

## 2014-12-22 DIAGNOSIS — E871 Hypo-osmolality and hyponatremia: Secondary | ICD-10-CM | POA: Diagnosis present

## 2014-12-22 DIAGNOSIS — A072 Cryptosporidiosis: Secondary | ICD-10-CM | POA: Diagnosis present

## 2014-12-22 DIAGNOSIS — R627 Adult failure to thrive: Secondary | ICD-10-CM

## 2014-12-22 DIAGNOSIS — Z79899 Other long term (current) drug therapy: Secondary | ICD-10-CM

## 2014-12-22 DIAGNOSIS — Z681 Body mass index (BMI) 19 or less, adult: Secondary | ICD-10-CM

## 2014-12-22 DIAGNOSIS — M79604 Pain in right leg: Secondary | ICD-10-CM | POA: Diagnosis present

## 2014-12-22 DIAGNOSIS — A529 Late syphilis, unspecified: Secondary | ICD-10-CM | POA: Insufficient documentation

## 2014-12-22 DIAGNOSIS — I959 Hypotension, unspecified: Secondary | ICD-10-CM | POA: Diagnosis present

## 2014-12-22 DIAGNOSIS — N179 Acute kidney failure, unspecified: Secondary | ICD-10-CM | POA: Diagnosis present

## 2014-12-22 DIAGNOSIS — R64 Cachexia: Secondary | ICD-10-CM | POA: Diagnosis present

## 2014-12-22 DIAGNOSIS — A539 Syphilis, unspecified: Secondary | ICD-10-CM

## 2014-12-22 DIAGNOSIS — E872 Acidosis: Secondary | ICD-10-CM | POA: Diagnosis present

## 2014-12-22 DIAGNOSIS — Z23 Encounter for immunization: Secondary | ICD-10-CM

## 2014-12-22 DIAGNOSIS — A530 Latent syphilis, unspecified as early or late: Secondary | ICD-10-CM | POA: Diagnosis present

## 2014-12-22 DIAGNOSIS — E860 Dehydration: Secondary | ICD-10-CM

## 2014-12-22 DIAGNOSIS — R1310 Dysphagia, unspecified: Secondary | ICD-10-CM | POA: Diagnosis present

## 2014-12-22 DIAGNOSIS — E43 Unspecified severe protein-calorie malnutrition: Secondary | ICD-10-CM | POA: Diagnosis present

## 2014-12-22 DIAGNOSIS — M79661 Pain in right lower leg: Secondary | ICD-10-CM

## 2014-12-22 DIAGNOSIS — M79606 Pain in leg, unspecified: Secondary | ICD-10-CM

## 2014-12-22 DIAGNOSIS — E46 Unspecified protein-calorie malnutrition: Secondary | ICD-10-CM

## 2014-12-22 HISTORY — DX: Personal history of other medical treatment: Z92.89

## 2014-12-22 HISTORY — DX: Dysphagia, unspecified: R13.10

## 2014-12-22 HISTORY — DX: Adult failure to thrive: R62.7

## 2014-12-22 HISTORY — DX: Pain in right lower leg: M79.661

## 2014-12-22 LAB — CBC WITH DIFFERENTIAL/PLATELET
BASOS ABS: 0 10*3/uL (ref 0.0–0.1)
BASOS PCT: 0 % (ref 0–1)
EOS PCT: 0 % (ref 0–5)
Eosinophils Absolute: 0 10*3/uL (ref 0.0–0.7)
HEMATOCRIT: 37.2 % — AB (ref 39.0–52.0)
Hemoglobin: 14.1 g/dL (ref 13.0–17.0)
Lymphocytes Relative: 19 % (ref 12–46)
Lymphs Abs: 0.9 10*3/uL (ref 0.7–4.0)
MCH: 30.3 pg (ref 26.0–34.0)
MCHC: 37.9 g/dL — AB (ref 30.0–36.0)
MCV: 79.8 fL (ref 78.0–100.0)
MONO ABS: 0.3 10*3/uL (ref 0.1–1.0)
MONOS PCT: 6 % (ref 3–12)
Neutro Abs: 3.6 10*3/uL (ref 1.7–7.7)
Neutrophils Relative %: 75 % (ref 43–77)
Platelets: 170 10*3/uL (ref 150–400)
RBC: 4.66 MIL/uL (ref 4.22–5.81)
RDW: 13 % (ref 11.5–15.5)
WBC: 4.8 10*3/uL (ref 4.0–10.5)

## 2014-12-22 LAB — COMPREHENSIVE METABOLIC PANEL
ALK PHOS: 77 U/L (ref 38–126)
ALT: 29 U/L (ref 17–63)
AST: 66 U/L — AB (ref 15–41)
Albumin: 3.6 g/dL (ref 3.5–5.0)
Anion gap: 15 (ref 5–15)
BILIRUBIN TOTAL: 1 mg/dL (ref 0.3–1.2)
BUN: 77 mg/dL — AB (ref 6–20)
CALCIUM: 8.9 mg/dL (ref 8.9–10.3)
CO2: 16 mmol/L — ABNORMAL LOW (ref 22–32)
CREATININE: 1.48 mg/dL — AB (ref 0.61–1.24)
Chloride: 87 mmol/L — ABNORMAL LOW (ref 101–111)
GFR calc Af Amer: >60 mL/min (ref >60)
Glucose, Bld: 104 mg/dL — ABNORMAL HIGH (ref 65–99)
Potassium: 3.6 mmol/L (ref 3.5–5.1)
Sodium: 118 mmol/L — CL (ref 135–145)
TOTAL PROTEIN: 7.6 g/dL (ref 6.5–8.1)

## 2014-12-22 LAB — LIPASE, BLOOD: LIPASE: 33 U/L (ref 22–51)

## 2014-12-22 LAB — TSH: TSH: 2.564 u[IU]/mL (ref 0.350–4.500)

## 2014-12-22 LAB — MAGNESIUM: MAGNESIUM: 2.2 mg/dL (ref 1.7–2.4)

## 2014-12-22 MED ORDER — AZITHROMYCIN 600 MG PO TABS: 1200.0000 mg | ORAL_TABLET | ORAL | Status: DC

## 2014-12-22 MED ORDER — DOLUTEGRAVIR SODIUM 50 MG PO TABS: 50.0000 mg | ORAL_TABLET | Freq: Every day | ORAL | Status: DC

## 2014-12-22 MED ORDER — SULFAMETHOXAZOLE-TRIMETHOPRIM 800-160 MG PO TABS
1.0000 | ORAL_TABLET | Freq: Every day | ORAL | Status: DC
  Administered 2014-12-23: 1 via ORAL
  Filled 2014-12-22 (×2): qty 1

## 2014-12-22 MED ORDER — DOLUTEGRAVIR SODIUM 50 MG PO TABS
50.0000 mg | ORAL_TABLET | Freq: Every day | ORAL | Status: DC
  Administered 2014-12-22 – 2015-01-01 (×11): 50 mg via ORAL
  Filled 2014-12-22 (×11): qty 1

## 2014-12-22 MED ORDER — PENICILLIN G BENZATHINE 1200000 UNIT/2ML IM SUSP
1.2000 10*6.[IU] | Freq: Once | INTRAMUSCULAR | Status: AC
  Administered 2014-12-22: 1.2 10*6.[IU] via INTRAMUSCULAR

## 2014-12-22 MED ORDER — EMTRICITABINE-TENOFOVIR AF 200-25 MG PO TABS: 1.0000 | ORAL_TABLET | Freq: Every day | ORAL | Status: AC

## 2014-12-22 MED ORDER — AZITHROMYCIN 600 MG PO TABS
1200.0000 mg | ORAL_TABLET | ORAL | Status: DC
  Administered 2014-12-22: 1200 mg via ORAL
  Filled 2014-12-22: qty 2

## 2014-12-22 MED ORDER — DRONABINOL 5 MG PO CAPS: 5.0000 mg | ORAL_CAPSULE | Freq: Two times a day (BID) | ORAL | Status: AC

## 2014-12-22 MED ORDER — ENSURE ENLIVE PO LIQD
237.0000 mL | Freq: Two times a day (BID) | ORAL | Status: DC
  Administered 2014-12-23 – 2014-12-24 (×3): 237 mL via ORAL

## 2014-12-22 MED ORDER — ONDANSETRON HCL 4 MG/2ML IJ SOLN
4.0000 mg | Freq: Four times a day (QID) | INTRAMUSCULAR | Status: DC | PRN
  Administered 2014-12-23 – 2014-12-31 (×7): 4 mg via INTRAVENOUS
  Filled 2014-12-22 (×7): qty 2

## 2014-12-22 MED ORDER — DOLUTEGRAVIR SODIUM 50 MG PO TABS: 50.0000 mg | ORAL_TABLET | Freq: Every day | ORAL | Status: AC

## 2014-12-22 MED ORDER — ENOXAPARIN SODIUM 40 MG/0.4ML ~~LOC~~ SOLN
40.0000 mg | SUBCUTANEOUS | Status: DC
  Administered 2014-12-22 – 2014-12-31 (×10): 40 mg via SUBCUTANEOUS
  Filled 2014-12-22 (×10): qty 0.4

## 2014-12-22 MED ORDER — ACETAMINOPHEN 325 MG PO TABS
650.0000 mg | ORAL_TABLET | Freq: Four times a day (QID) | ORAL | Status: DC | PRN
  Administered 2014-12-22 – 2014-12-29 (×6): 650 mg via ORAL
  Filled 2014-12-22 (×7): qty 2

## 2014-12-22 MED ORDER — DRONABINOL 2.5 MG PO CAPS
5.0000 mg | ORAL_CAPSULE | Freq: Two times a day (BID) | ORAL | Status: DC
  Administered 2014-12-23 – 2015-01-01 (×19): 5 mg via ORAL
  Filled 2014-12-22 (×19): qty 2

## 2014-12-22 MED ORDER — EMTRICITABINE-TENOFOVIR AF 200-25 MG PO TABS: 1.0000 | ORAL_TABLET | Freq: Every day | ORAL | Status: DC

## 2014-12-22 MED ORDER — SULFAMETHOXAZOLE-TRIMETHOPRIM 800-160 MG PO TABS: 1.0000 | ORAL_TABLET | Freq: Two times a day (BID) | ORAL | Status: DC

## 2014-12-22 MED ORDER — ONDANSETRON HCL 4 MG PO TABS: 4.0000 mg | ORAL_TABLET | Freq: Four times a day (QID) | ORAL | Status: DC | PRN

## 2014-12-22 MED ORDER — ONDANSETRON HCL 4 MG PO TABS: 4.0000 mg | ORAL_TABLET | Freq: Three times a day (TID) | ORAL | Status: DC | PRN

## 2014-12-22 MED ORDER — BOOST / RESOURCE BREEZE PO LIQD
1.0000 | Freq: Three times a day (TID) | ORAL | Status: DC
  Administered 2014-12-23 – 2015-01-01 (×4): 1 via ORAL

## 2014-12-22 MED ORDER — EMTRICITABINE-TENOFOVIR DF 200-300 MG PO TABS
1.0000 | ORAL_TABLET | Freq: Every day | ORAL | Status: DC
  Administered 2014-12-22 – 2014-12-23 (×2): 1 via ORAL
  Filled 2014-12-22 (×2): qty 1

## 2014-12-22 MED ORDER — ACETAMINOPHEN 650 MG RE SUPP: 650.0000 mg | Freq: Four times a day (QID) | RECTAL | Status: DC | PRN

## 2014-12-22 MED ORDER — POTASSIUM CHLORIDE IN NACL 20-0.9 MEQ/L-% IV SOLN
INTRAVENOUS | Status: DC
  Administered 2014-12-22 – 2014-12-23 (×2): via INTRAVENOUS
  Filled 2014-12-22 (×5): qty 1000

## 2014-12-22 MED ORDER — INFLUENZA VAC SPLIT QUAD 0.5 ML IM SUSY
0.5000 mL | PREFILLED_SYRINGE | INTRAMUSCULAR | Status: AC
  Administered 2014-12-23: 0.5 mL via INTRAMUSCULAR
  Filled 2014-12-22: qty 0.5

## 2014-12-22 NOTE — Progress Notes (Signed)
Subjective:    Patient ID: Brian Adkins, male    DOB: 01/26/1986, 29 y.o.   MRN: [id number]  HPI  29 year old with HIV/AIDS, originally diagnosed in 2010 or even before then (in 2010 had VL >100k and CD4 just above 300). He had intake scheduled with Brian Adkins in 2012 but never established care here or elsewhere. He was recently admitted to [facility] where he was found to have CD4 <10, AIDS, diarrhea, vomiting and GI pathogen panel + norovirus and giardia. He was referred to our clinic and my CMA Brian Adkins worked him in with me on urgent basis today. He is living with his sister and mother were now much involved in his care. He was discharged with Bactrim to take for PCP prophylaxis and his mother has been thanks for these medications. He was also given 2.4 million units intramuscular penicillin for positive RPR 1-16.  Since discharge the hospital he continues to have diarrhea throughout the day both with eating and he is not eating. He has nausea and occasionally nonbloody vomitus. He is out of his antiemetics.  He currently has no pain or source clearly needs to be enrolled into the ADAP program and he needs an orange card.  In addition to the complaints of weight loss fever nausea vomiting malaise for appetite dysphagia also has had a right calf pain since he was in the hospital. He does remember being given injections of Lovenox while an inpatient but nonetheless new onset of calf pain the right leg doesn't be concerned for possible deep venous thrombosis.     Review of Systems  Constitutional: Positive for fever, chills, diaphoresis, activity change, appetite change, fatigue and unexpected weight change.  HENT: Positive for sore throat and trouble swallowing. Negative for rhinorrhea, sinus pressure and sneezing.   Eyes: Negative for pain and visual disturbance.  Respiratory: Positive for cough. Negative for chest tightness, shortness of breath, wheezing and stridor.   Cardiovascular:  Positive for palpitations and leg swelling. Negative for chest pain.  Gastrointestinal: Positive for nausea, vomiting, abdominal pain and diarrhea. Negative for constipation, blood in stool, abdominal distention and anal bleeding.  Endocrine: Positive for cold intolerance.  Genitourinary: Negative for dysuria, hematuria, flank pain and difficulty urinating.  Musculoskeletal: Positive for myalgias. Negative for back pain, joint swelling, arthralgias and gait problem.  Skin: Positive for pallor. Negative for color change, rash and wound.  Allergic/Immunologic: Positive for immunocompromised state.  Neurological: Positive for dizziness and light-headedness. Negative for tremors and weakness.  Hematological: Negative for adenopathy. Does not bruise/bleed easily.  Psychiatric/Behavioral: Positive for dysphoric mood. Negative for behavioral problems, confusion, sleep disturbance, decreased concentration and agitation.       Objective:   Physical Exam  Constitutional: He appears lethargic. He appears cachectic. He has a sickly appearance. He appears ill.  HENT:  Head: Normocephalic.  Mouth/Throat: Uvula is midline and oropharynx is clear and moist. No oropharyngeal exudate.  Eyes: Conjunctivae and EOM are normal. No scleral icterus.  Neck: Normal range of motion. Neck supple. No JVD present.  Cardiovascular: Normal rate and regular rhythm.  Exam reveals no gallop and no friction rub.   No murmur heard. Pulmonary/Chest: Effort normal and breath sounds normal. No respiratory distress. He has no wheezes. He has no rales.  Abdominal: Soft. He exhibits no distension. There is tenderness. There is no rebound and no guarding.  Musculoskeletal: Normal range of motion.  Neurological: He appears lethargic.  Skin: Skin is dry. He is not diaphoretic.  There is pallor.  Psychiatric: He is withdrawn. He exhibits a depressed mood.          Assessment & Plan:   HIV/AIDS: Brian Adkins has helped me fill out  [employer] He also will need ADAP paperwork done. Plan for ARV's is Tivicay and Descovy.  (if absorption becomes concern could even consider addition of IV fuzeon with above drugs)  He will need PCP prophylaxis and for now can take crushed bactrim as outpatient since he has this (liquid bactrim as inpatient)  Will check G6PD level in case we want to use dapsone.  Would give him azithromycin 1200 mg weekly for Mycobacterium avium prophylaxis  Failure to thrive in the setting of AIDS, Cryptosporidium infection with nausea vomiting diarrhea and dysphagia: --will admit him to [facility] for IVF hydration, nutritional optimization, PT, OT --he may need placement in SNF given how debilitated his currently is --check AFB blood cultures --if he does not have CDiff can use anti motility agents  Cryptosporidium: MAIN ISSUE Is Immune reconstitution Can try to get nitazoxanide or use paromomycin  Right leg calf pain: rule out DVT, would get doppler  Dysphagia: No thrush on exam today but if he has persistent problems swallowing may consider repeat course of fluconazole and if this does not help an EGD  Syphilis: needs IM PCN 2.4 MU weekly x 3 weeks  I spent greater than 60 minutes with the patient including greater than 50% of time in face to face counsel of the patient re his HIV/AIDS, his cryptosporidium, norovirus infections, dysphagia, failure to thrive and in coordination of their care.

## 2014-12-22 NOTE — H&P (Signed)
History and Physical  Brian Adkins WUJ:[id number] DOB: [birth year] DOA: 12/22/2014   PCP: No PCP Per Patient  Referring Physician: ED/ Dr. Algis Liming  Chief Complaint: N/V/D  HPI:  29 year old male with a history of HIV/AIDS diagnosed in 2010 who has never been on any anti-retroviral medications was recently discharged from [facility] on 12/12/2014 after treatment for abdominal pain and acute kidney injury, diarrhea. During that admission, CT of the abdomen and pelvis was negative for any acute findings. He was sent home with Bactrim DS daily and fluconazole. During that admission, stool studies were positive for Cryptosporidium and norovirus. His stool cultures were negative. After discharge, the patient continued to have diarrhea. He tried Imodium which offered some help, but he continued to have watery stools without hematochezia. The patient denies any fevers, chills, chest pain, shortness of breath, coughing, hemoptysis, headaches, rashes. He has had 4 bowel movements daily. He has had nausea and vomiting daily since discharge without any hematemesis or coffee grounds. The patient has also been complaining of solid food dysphagia for 1-2 weeks. He denies any travels or raw or undercooked foods. He works at [employer]. He went to see infectious disease, Dr. Daiva Eves, who requested admission for FTT.  Assessment/Plan: Dehydration/failure to thrive -Secondary to diarrhea which is secondary to Cryptosporidium and norovirus -Check C. difficile PCR--if negative, start using Imodium -AFB blood cultures -IV fluids -Check LFTs and lipase -TSH -UA -UDS Cryptosporidium -ultimately needs immune reconstitution -unclear if will benefit from nitazoxanide or paromomycin--defer to ID -if LFTs elevated, will need to consider cryptosporidium cholangitis Right leg pain -duplex r/o DVT Dysphagia -no thrush on exam -clears -defer to ID if want to start empiric fluconazole -he has been  taking once weekly fluconazole AIDS --12/08/2014--CD4 <10 --12/08/2014 HIV RNA--75420 -will not start antiretrovirals until confirmation he can obtain meds after d/c--awaiting ADAP -continue Bactrim and azithromycin for prophylaxis -G6PD level Secondary late latent syphilis -The patient received 2.4 million units benzathine penicillin at [facility] -will need to restart and give 2.4MU IM q week x 3       Past Medical History  Diagnosis Date  . Ulcerative colitis     diagnosed 2010, left sided.  . Empyema     2010  . GI bleed   . MRSA bacteremia   . HIV (human immunodeficiency virus infection)     2010, patient never follow up with ID  . Anemia   . Pericardial effusion   . CAP (community acquired pneumonia)     2012  . Gastroenteritis due to Cryptosporidium 12/11/2014  . Gastroenteritis due to norovirus 12/11/2014  . Syphilis 12/12/2014  . Failure to thrive in adult 12/22/2014  . Dysphagia 12/22/2014  . Right calf pain 12/22/2014   Past Surgical History  Procedure Laterality Date  . Thoracentesis    . Colonoscopy  2010    Dr. Jena Gauss. Left-sided proctocolitis   Social History:  reports that he has never smoked. He does not have any smokeless tobacco history on file. He reports that he drinks alcohol. He reports that he does not use illicit drugs.   Family History  Problem Relation Age of Onset  . Colon cancer Neg Hx   . Inflammatory bowel disease Neg Hx   . Healthy Mother      No Known Allergies    Prior to Admission medications   Medication Sig Start Date End Date Taking? Authorizing Provider  azithromycin (ZITHROMAX) 600 MG tablet Take 2 tablets (1,200  mg total) by mouth every 7 (seven) days. 12/22/14   Randall Hiss, MD  B Complex Vitamins (VITAMIN B COMPLEX PO) Take by mouth.    Historical Provider, MD  dolutegravir (TIVICAY) 50 MG tablet Take 1 tablet (50 mg total) by mouth daily. 12/22/14   Randall Hiss, MD  dronabinol (MARINOL) 5 MG capsule Take 1  capsule (5 mg total) by mouth 2 (two) times daily before a meal. 12/22/14   Randall Hiss, MD  emtricitabine-tenofovir AF (DESCOVY) 200-25 MG per tablet Take 1 tablet by mouth daily. 12/22/14   Randall Hiss, MD  fluconazole (DIFLUCAN) 100 MG tablet Take 1 tablet (100 mg total) by mouth once a week. 12/12/14   Elliot Cousin, MD  Multiple Vitamin (MULTIVITAMIN WITH MINERALS) TABS tablet Take 1 tablet by mouth daily.    Historical Provider, MD  ondansetron (ZOFRAN) 4 MG tablet Take 1 tablet (4 mg total) by mouth every 8 (eight) hours as needed for nausea or vomiting. 12/22/14   Randall Hiss, MD  sulfamethoxazole-trimethoprim (BACTRIM DS,SEPTRA DS) 800-160 MG per tablet Take 1 tablet by mouth 2 (two) times daily. 12/22/14   Randall Hiss, MD  traMADol (ULTRAM) 50 MG tablet Take 1 tablet (50 mg total) by mouth every 6 (six) hours as needed. Patient not taking: Reported on 12/22/2014 12/02/14   Bethann Berkshire, MD    Review of Systems:  Constitutional:  No weight loss, night sweats, Fevers, chills, fatigue.  Head&Eyes: No headache.  No vision loss.  No eye pain or scotoma ENT:  No Difficulty swallowing,Tooth/dental problems,Sore throat,  No ear ache, post nasal drip,  Cardio-vascular:  No chest pain, Orthopnea, PND, swelling in lower extremities,  dizziness, palpitations  GI:  No  abdominal pain, nausea, vomiting, diarrhea, loss of appetite, hematochezia, melena, heartburn, indigestion, Resp:  No shortness of breath with exertion or at rest. No cough. No coughing up of blood .No wheezing.No chest wall deformity  Skin:  no rash or lesions.  GU:  no dysuria, change in color of urine, no urgency or frequency. No flank pain.  Musculoskeletal:  No joint pain or swelling. No decreased range of motion. No back pain.  Psych:  No change in mood or affect. No depression or anxiety. Neurologic: No headache, no dysesthesia, no focal weakness, no vision loss. No syncope  Physical  Exam: Filed Vitals:   12/22/14 1751  BP: 90/64  Pulse: 97  Temp: 97.4 F (36.3 C)  TempSrc: Oral  Resp: 18  SpO2: 98%   General:  A&O x 3, NAD, nontoxic, pleasant/cooperative Head/Eye: No conjunctival hemorrhage, no icterus, [place]/AT, No nystagmus ENT:  No icterus,  No thrush, good dentition, no pharyngeal exudate Neck:  No masses, no lymphadenpathy, no bruits CV:  RRR, no rub, no gallop, no S3 Lung:  CTAB, good air movement, no wheeze, no rhonchi Abdomen: soft/NT, +BS, nondistended, no peritoneal signs Ext: No cyanosis, No rashes, No petechiae, No lymphangitis, No edema Neuro: CNII-XII intact, strength 4/5 in bilateral upper and lower extremities, no dysmetria  Labs on Admission:  Basic Metabolic Panel: No results for input(s): NA, K, CL, CO2, GLUCOSE, BUN, CREATININE, CALCIUM, MG, PHOS in the last 168 hours. Liver Function Tests: No results for input(s): AST, ALT, ALKPHOS, BILITOT, PROT, ALBUMIN in the last 168 hours. No results for input(s): LIPASE, AMYLASE in the last 168 hours. No results for input(s): AMMONIA in the last 168 hours. CBC: No results for input(s): WBC, NEUTROABS,  HGB, HCT, MCV, PLT in the last 168 hours. Cardiac Enzymes: No results for input(s): CKTOTAL, CKMB, CKMBINDEX, TROPONINI in the last 168 hours. BNP: Invalid input(s): POCBNP CBG: No results for input(s): GLUCAP in the last 168 hours.  Radiological Exams on Admission: No results found.     Time spent:60 minutes Code Status:   FULL Family Communication:   Family at bedside updated   Tonyia Marschall, DO  [facility] Pager [phone]  If 7PM-7AM, please contact night-coverage [email or web address] Password [facility] 12/22/2014, 5:54 PM

## 2014-12-22 NOTE — Telephone Encounter (Signed)
[employer] application. 

## 2014-12-22 NOTE — Progress Notes (Signed)
           [facility]   I am going to start ARV on this gentleman. Brian Adkins filled out [employer] application for him which I signed in clinic. We will be able typically to get ARVs for him within 48 hours of submission of -[employer]. We may run into weekend but in that case may even be able to get him few days supply from pharmacy  I will let Dr. Ninetta Lights know about him for  Tomorrow  I personally think he may need SNF placement

## 2014-12-23 ENCOUNTER — Inpatient Hospital Stay (HOSPITAL_COMMUNITY): Payer: Medicaid Other

## 2014-12-23 DIAGNOSIS — N179 Acute kidney failure, unspecified: Secondary | ICD-10-CM

## 2014-12-23 DIAGNOSIS — M79606 Pain in leg, unspecified: Secondary | ICD-10-CM

## 2014-12-23 DIAGNOSIS — A072 Cryptosporidiosis: Secondary | ICD-10-CM

## 2014-12-23 DIAGNOSIS — R627 Adult failure to thrive: Secondary | ICD-10-CM

## 2014-12-23 DIAGNOSIS — B20 Human immunodeficiency virus [HIV] disease: Principal | ICD-10-CM

## 2014-12-23 DIAGNOSIS — A539 Syphilis, unspecified: Secondary | ICD-10-CM

## 2014-12-23 DIAGNOSIS — E43 Unspecified severe protein-calorie malnutrition: Secondary | ICD-10-CM | POA: Insufficient documentation

## 2014-12-23 LAB — BASIC METABOLIC PANEL
Anion gap: 11 (ref 5–15)
BUN: 45 mg/dL — ABNORMAL HIGH (ref 6–20)
CALCIUM: 8.3 mg/dL — AB (ref 8.9–10.3)
CHLORIDE: 96 mmol/L — AB (ref 101–111)
CO2: 13 mmol/L — ABNORMAL LOW (ref 22–32)
CREATININE: 1.06 mg/dL (ref 0.61–1.24)
Glucose, Bld: 102 mg/dL — ABNORMAL HIGH (ref 65–99)
Potassium: 4.9 mmol/L (ref 3.5–5.1)
SODIUM: 120 mmol/L — AB (ref 135–145)

## 2014-12-23 LAB — URINALYSIS, ROUTINE W REFLEX MICROSCOPIC
Bilirubin Urine: NEGATIVE
GLUCOSE, UA: NEGATIVE mg/dL
Hgb urine dipstick: NEGATIVE
Ketones, ur: 15 mg/dL — AB
LEUKOCYTES UA: NEGATIVE
Nitrite: NEGATIVE
PROTEIN: 30 mg/dL — AB
Specific Gravity, Urine: 1.023 (ref 1.005–1.030)
UROBILINOGEN UA: 0.2 mg/dL (ref 0.0–1.0)
pH: 5.5 (ref 5.0–8.0)

## 2014-12-23 LAB — URINE MICROSCOPIC-ADD ON

## 2014-12-23 LAB — RAPID URINE DRUG SCREEN, HOSP PERFORMED
AMPHETAMINES: NOT DETECTED
BENZODIAZEPINES: NOT DETECTED
Barbiturates: NOT DETECTED
Cocaine: NOT DETECTED
OPIATES: NOT DETECTED
Tetrahydrocannabinol: NOT DETECTED

## 2014-12-23 LAB — C DIFFICILE QUICK SCREEN W PCR REFLEX
C Diff antigen: NEGATIVE
C Diff interpretation: NEGATIVE
C Diff toxin: NEGATIVE

## 2014-12-23 LAB — GLUCOSE 6 PHOSPHATE DEHYDROGENASE
G6PDH: 8.8 U/g{Hb} (ref 4.6–13.5)
Hemoglobin: 14.4 g/dL (ref 12.6–17.7)

## 2014-12-23 MED ORDER — SULFAMETHOXAZOLE-TRIMETHOPRIM 800-160 MG PO TABS: 1.0000 | ORAL_TABLET | ORAL | Status: DC

## 2014-12-23 MED ORDER — AZITHROMYCIN 600 MG PO TABS
1200.0000 mg | ORAL_TABLET | ORAL | Status: DC
  Administered 2014-12-29: 1200 mg via ORAL
  Filled 2014-12-23: qty 2

## 2014-12-23 MED ORDER — SODIUM CHLORIDE 0.9 % IV SOLN
INTRAVENOUS | Status: DC
  Filled 2014-12-23: qty 1000

## 2014-12-23 MED ORDER — ZOLPIDEM TARTRATE 5 MG PO TABS
5.0000 mg | ORAL_TABLET | Freq: Once | ORAL | Status: AC
  Administered 2014-12-23: 5 mg via ORAL
  Filled 2014-12-23: qty 1

## 2014-12-23 MED ORDER — SODIUM CHLORIDE 0.9 % IV SOLN
INTRAVENOUS | Status: DC
  Administered 2014-12-23 – 2014-12-28 (×9): via INTRAVENOUS

## 2014-12-23 MED ORDER — EMTRICITABINE-TENOFOVIR AF 200-25 MG PO TABS
1.0000 | ORAL_TABLET | Freq: Every day | ORAL | Status: DC
  Administered 2014-12-24 – 2015-01-01 (×9): 1 via ORAL
  Filled 2014-12-23 (×12): qty 1

## 2014-12-23 NOTE — Progress Notes (Signed)
INFECTIOUS DISEASE PROGRESS NOTE  ID: Brian Adkins is a 29 y.o. male with  Active Problems:   Diarrhea   Human immunodeficiency virus (HIV) disease   AIDS   Gastroenteritis due to Cryptosporidium   Failure to thrive in adult   Dysphagia   Dehydration   Protein-calorie malnutrition, severe  Subjective: Diarrhea unchanged.  Has been eating well, wants diet advanced.   Abtx:  Anti-infectives    Start     Dose/Rate Route Frequency Ordered Stop   12/29/14 1000  azithromycin (ZITHROMAX) tablet 1,200 mg     1,200 mg Oral Every Wed 12/23/14 0819     12/23/14 1000  sulfamethoxazole-trimethoprim (BACTRIM DS,SEPTRA DS) 800-160 MG per tablet 1 tablet     1 tablet Oral Daily 12/22/14 1753     12/22/14 2245  azithromycin (ZITHROMAX) tablet 1,200 mg  Status:  Discontinued     1,200 mg Oral Every 7 days 12/22/14 1753 12/23/14 0819   12/22/14 2145  dolutegravir (TIVICAY) tablet 50 mg     50 mg Oral Daily 12/22/14 2131     12/22/14 2131  emtricitabine-tenofovir (TRUVADA) 200-300 MG per tablet 1 tablet     1 tablet Oral Daily 12/22/14 2131        Medications:  Scheduled: . [START ON 12/29/2014] azithromycin  1,200 mg Oral Q Wed  . dolutegravir  50 mg Oral Daily  . dronabinol  5 mg Oral BID AC  . emtricitabine-tenofovir  1 tablet Oral Daily  . enoxaparin (LOVENOX) injection  40 mg Subcutaneous Q24H  . feeding supplement  1 Container Oral TID BM  . feeding supplement (ENSURE ENLIVE)  237 mL Oral BID BM  . sulfamethoxazole-trimethoprim  1 tablet Oral Daily    Objective: Vital signs in last 24 hours: Temp:  [97.4 F (36.3 C)-97.8 F (36.6 C)] 97.7 F (36.5 C) (09/01 1046) Pulse Rate:  [88-97] 88 (09/01 1046) Resp:  [16-18] 16 (09/01 1046) BP: (83-91)/(50-65) 91/63 mmHg (09/01 1046) SpO2:  [98 %-100 %] 99 % (09/01 1046) Weight:  [42.366 kg (93 lb 6.4 oz)] 42.366 kg (93 lb 6.4 oz) (08/31 2027)   General appearance: alert, cooperative, cachectic and no distress Resp: clear to  auscultation bilaterally Cardio: regular rate and rhythm GI: normal findings: bowel sounds normal and soft, non-tender  Lab Results  Recent Labs  12/22/14 2102  WBC 4.8  HGB 14.1  HCT 37.2*  NA 118*  K 3.6  CL 87*  CO2 16*  BUN 77*  CREATININE 1.48*   Liver Panel  Recent Labs  12/22/14 2102  PROT 7.6  ALBUMIN 3.6  AST 66*  ALT 29  ALKPHOS 77  BILITOT 1.0   Sedimentation Rate No results for input(s): ESRSEDRATE in the last 72 hours. C-Reactive Protein No results for input(s): CRP in the last 72 hours.  Microbiology: Recent Results (from the past 240 hour(s))  C difficile quick scan w PCR reflex     Status: None   Collection Time: 12/22/14 11:24 PM  Result Value Ref Range Status   C Diff antigen NEGATIVE NEGATIVE Final   C Diff toxin NEGATIVE NEGATIVE Final   C Diff interpretation Negative for toxigenic C. difficile  Final    Studies/Results: No results found.   Assessment/Plan: AIDS  start him on tivicay and truvada (descovey not available in hospital).    Genotype pending  Will change him to descovey as soon as it is available  CM is working on getting him emergency HIV drug coverage  ARF  Will repeat his Cr in AM.   Suspect related to his diarrhea.  Cryptosporidium  Will start him on nitazoxamide if he does not improve.     Failure to thrive  nutrition eval  Adv diet as tolerated  Syphillis  Plan for PEN IM weekly x3 (next dose on 9-6)  OI prophylaxis  Change his bactrim to TIW due to ARF         [name] Infectious Diseases (pager) [phone] [email or web address] 12/23/2014, 4:03 PM  LOS: 1 day

## 2014-12-23 NOTE — Progress Notes (Signed)
Utilization review completed. Nollie Shiflett, RN, BSN. 

## 2014-12-23 NOTE — Progress Notes (Signed)
Initial Nutrition Assessment  DOCUMENTATION CODES:   Severe malnutrition in context of chronic illness, Underweight  INTERVENTION:   Continue Boost Breeze po TID, each supplement provides 250 kcal and 9 grams of protein.  Once diet advances, provide Ensure Enlive po BID, each supplement provides 350 kcal and 20 grams of protein.  Encourage adequate PO intake.   NUTRITION DIAGNOSIS:   Malnutrition related to chronic illness as evidenced by percent weight loss, severe depletion of body fat, severe depletion of muscle mass.  GOAL:   Patient will meet greater than or equal to 90% of their needs  MONITOR:   Diet advancement, PO intake, Supplement acceptance, Weight trends, Labs, I & O's  REASON FOR ASSESSMENT:   Malnutrition Screening Tool    ASSESSMENT:   29 year old male with a history of HIV/AIDS diagnosed in 2010 who has never been on any anti-retroviral medications was recently discharged from [facility] on 12/12/2014 after treatment for abdominal pain and acute kidney injury, diarrhea.  Pt reports appetite is fine currently. No percent meal completion recorded, however pt reports 100% intake while on clear liquid diet. PTA pt does report eating 3 meals at home however intake at meals have been decreased due to dysphagia which has been ongoing over the past 2 weeks. Pt endorses weight loss with usual body weight ~120 lbs 2 months ago. Per Epic weight records, pt with a 28% weight loss in 1 year. Pt has been consuming Boost at home 1-2 times daily to aid caloric and protein needs. Pt currently has Resource Breeze ordered TID and Ensure ordered BID once diet advances. RD to continue with current orders.   Nutrition-Focused physical exam completed. Findings are severe fat depletion, severe muscle depletion, and no edema.   Labs: Low sodium, chloride, CO2. High BUN, creatinine, AST.  Diet Order:  Diet clear liquid Room service appropriate?: Yes; Fluid consistency:: Thin  Skin:   Reviewed, no issues  Last BM:  8/31 diarrhea  Height:   Ht Readings from Last 1 Encounters:  12/08/14  (1.575 m)    Weight:   Wt Readings from Last 1 Encounters:  12/22/14 93 lb 6.4 oz (42.366 kg)    Ideal Body Weight:  53.6 kg  BMI:  Body mass index is 17.08 kg/(m^2).  Estimated Nutritional Needs:   Kcal:  1600-1800  Protein:  65-80 grams  Fluid:  1.6 - 1.8 L/day  EDUCATION NEEDS:   No education needs identified at this time  Roslyn Smiling, MS, RD, LDN Pager # [phone] After hours/ weekend pager # [phone]

## 2014-12-23 NOTE — Progress Notes (Signed)
PROGRESS NOTE  Brian Adkins WUJ:[id number] DOB: 1985/05/31 DOA: 12/22/2014 PCP: No PCP Per Patient  Brief History 29 year old male with a history of HIV/AIDS diagnosed in 2010 who has never been on any anti-retroviral medications was recently discharged from [facility] on 12/12/2014 after treatment for abdominal pain and acute kidney injury, diarrhea. During that admission, CT of the abdomen and pelvis was negative for any acute findings. He was sent home with Bactrim DS daily and fluconazole. During that admission, stool studies were positive for Cryptosporidium and norovirus. His stool cultures were negative. After discharge, the patient continued to have diarrhea. He tried Imodium which offered some help, but he continued to have watery stools without hematochezia. The patient denies any fevers, chills, chest pain, shortness of breath, coughing, hemoptysis, headaches, rashes. He has had 4 bowel movements daily. He has had nausea and vomiting daily since discharge without any hematemesis or coffee grounds. The patient has also been complaining of solid food dysphagia for 1-2 weeks. He denies any travels or raw or undercooked foods. He works at [employer]. He went to see infectious disease, Dr. Daiva Eves, who requested admission for FTT.  Assessment/Plan: Dehydration/failure to thrive -Secondary to diarrhea which is secondary to Cryptosporidium and norovirus -Check C. 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 -unclear if will benefit from nitazoxanide or paromomycin--defer to ID Right leg pain -duplex r/o DVT--neg Dysphagia -no thrush on exam -clears--tolerating-->advance to full liquid diet -defer to ID if want to start empiric fluconazole -he has been taking once weekly  fluconazole AIDS --12/08/2014--CD4 <10 --12/08/2014 HIV RNA--75420 -will not start antiretrovirals until confirmation he can obtain meds after d/c--awaiting ADAP -continue Bactrim and azithromycin for prophylaxis -G6PD level Secondary late latent syphilis -The patient received 2.4 million units benzathine penicillin at [facility] -will need to restart and give 2.4MU IM q week x 3 Generalized weakness/Deconditioning -PT eval Severe protein calorie malnutrition --Continue nutritional supplementation Procedures/Studies: Ct Abdomen Pelvis Wo Contrast  12/08/2014   CLINICAL DATA:  Left lower quadrant and bilateral flank pain. Nausea. History of ulcerative colitis.  EXAM: CT ABDOMEN AND PELVIS WITHOUT CONTRAST  TECHNIQUE: Multidetector CT imaging of the abdomen and pelvis was performed following the standard protocol without IV contrast.  COMPARISON:  None.  FINDINGS: Linear scarring in the right lung base. Left base clear. Heart is normal size. No effusions.  Paucity of intraperitoneal fat and lack of intravenous contrast somewhat limits study. Liver, gallbladder, spleen, pancreas, adrenals and kidneys have an unremarkable unenhanced appearance. No hydronephrosis.  No evidence of bowel obstruction. Stomach, large and small bowel grossly unremarkable. No visible areas of bowel wall thickening. No visible free fluid, free air or adenopathy. Urinary bladder is unremarkable. No acute bony abnormality.  IMPRESSION: Study somewhat limited by lack of intraperitoneal fat and IV contrast. No visible acute findings on this nonenhanced CT.   Electronically Signed   By: Charlett Nose M.D.   On: 12/08/2014 12:35   Dg Chest 2 View  12/08/2014   CLINICAL DATA:  Increasingly worse cough today  EXAM: CHEST  2 VIEW  COMPARISON:  Multiple prior studies including 12/02/2014  FINDINGS: The heart size and mediastinal contours are within normal limits. Both lungs are clear. The visualized skeletal structures are unremarkable.   IMPRESSION: No active cardiopulmonary disease.   Electronically Signed   By: Marcy Salvo  Rubner M.D.   On: 12/08/2014 15:48   Dg Abd Acute W/chest  12/02/2014   CLINICAL DATA:  29 year old male with diarrhea, nausea with increase p.o. intake and mid abdominal pain cramping for the past 7 days. History of ulcerative colitis.  EXAM: DG ABDOMEN ACUTE W/ 1V CHEST  COMPARISON:  Chest x-ray 01/09/2014.  FINDINGS: Mild scarring in the periphery of the right lung base, unchanged compared to the prior examination. Lung volumes are normal. No consolidative airspace disease. No pleural effusions. No pneumothorax. No pulmonary nodule or mass noted. Pulmonary vasculature and the cardiomediastinal silhouette are within normal limits.  Air-fluid levels are present in the colon. No pathologic dilatation of small bowel or colon. No pneumoperitoneum. Calcifications projecting over the upper abdomen are favored to be pancreatic. High density material in the right lower quadrant maybe postoperative, particularly if there is prior history of partial bowel resection.  IMPRESSION: 1. Nonspecific bowel gas pattern, as above. While findings do not indicate bowel obstruction, the presence of multiple air-fluid levels in the colon is compatible with the reported history of diarrhea, and could indicate active colitis. 2. No pneumoperitoneum. 3. No radiographic evidence of acute cardiopulmonary disease.   Electronically Signed   By: Trudie Reed M.D.   On: 12/02/2014 18:42        Subjective: Patient's family but better today. Still having multiple loose bowel movements. Denies any hematochezia, melena. Denies any fevers, chills, headache, chest discomfort, shortness of breath, vomiting, abdominal pain. Denies any rashes or synovitis.  Objective: Filed Vitals:   12/22/14 1751 12/22/14 2027 12/23/14 0441 12/23/14 1046  BP: 91/63  Pulse: 97 94 92 88  Temp: 97.4 F (36.3 C)  97.8 F (36.6 C) 97.7 F (36.5 C)   TempSrc: Oral   Oral  Resp: Weight:  42.366 kg (93 lb 6.4 oz)    SpO2: 98% 100% 100% 99%    Intake/Output Summary (Last 24 hours) at 12/23/14 1532 Last data filed at 12/23/14 0600  Gross per 24 hour  Intake 1528.33 ml  Output    300 ml  Net 1228.33 ml   Weight change:  Exam:   General:  Pt is alert, follows commands appropriately, not in acute distress  HEENT: No icterus, No thrush, No neck mass, [place]/AT  Cardiovascular: RRR, S1/S2, no rubs, no gallops  Respiratory: CTA bilaterally, no wheezing, no crackles, no rhonchi  Abdomen: Soft/+BS, non tender, non distended, no guarding  Extremities: No edema, No lymphangitis, No petechiae, No rashes, no synovitis  Data Reviewed: Basic Metabolic Panel:  Recent Labs Lab 12/22/14 2102  NA 118*  K 3.6  CL 87*  CO2 16*  GLUCOSE 104*  BUN 77*  CREATININE 1.48*  CALCIUM 8.9  MG 2.2   Liver Function Tests:  Recent Labs Lab 12/22/14 2102  AST 66*  ALT 29  ALKPHOS 77  BILITOT 1.0  PROT 7.6  ALBUMIN 3.6    Recent Labs Lab 12/22/14 2102  LIPASE 33   No results for input(s): AMMONIA in the last 168 hours. CBC:  Recent Labs Lab 12/22/14 2102  WBC 4.8  NEUTROABS 3.6  HGB 14.1  HCT 37.2*  MCV 79.8  PLT 170   Cardiac Enzymes: No results for input(s): CKTOTAL, CKMB, CKMBINDEX, TROPONINI in the last 168 hours. BNP: Invalid input(s): POCBNP CBG: No results for input(s): GLUCAP in the last 168 hours.  Recent Results (from the past 240 hour(s))  C difficile quick scan w PCR reflex  Status: None   Collection Time: 12/22/14 11:24 PM  Result Value Ref Range Status   C Diff antigen NEGATIVE NEGATIVE Final   C Diff toxin NEGATIVE NEGATIVE Final   C Diff interpretation Negative for toxigenic C. difficile  Final     Scheduled Meds: . [START ON 12/29/2014] azithromycin  1,200 mg Oral Q Wed  . dolutegravir  50 mg Oral Daily  . dronabinol  5 mg Oral BID AC  . emtricitabine-tenofovir  1 tablet Oral  Daily  . enoxaparin (LOVENOX) injection  40 mg Subcutaneous Q24H  . feeding supplement  1 Container Oral TID BM  . feeding supplement (ENSURE ENLIVE)  237 mL Oral BID BM  . sulfamethoxazole-trimethoprim  1 tablet Oral Daily   Continuous Infusions:    Veeda Virgo, DO  [facility] Pager [phone]  If 7PM-7AM, please contact night-coverage [email or web address] Password [email or web address] 12/23/2014, 3:32 PM   LOS: 1 day

## 2014-12-23 NOTE — Progress Notes (Signed)
Right Lower Ext. Venous Duplex Completed. Negative for deep or superficial vein thrombosis in the right lower extremity. Ernst Cumpston, BS, RDMS, RVT  

## 2014-12-23 NOTE — Clinical Documentation Improvement (Signed)
Hospitalist  Abnormal Lab/Test Results:   Sodium: 8/31: 118.  Possible Clinical Conditions associated with below indicators  Hyponatremia  Other Condition  Cannot Clinically Determine   Treatment Provided: 9/01:  0.9% NaCl w/20 meq kcl @ 100 ml/hr.   Please exercise your independent, professional judgment when responding. A specific answer is not anticipated or expected.   Thank You,  Rodman Pickle [facility] Information Management [place]

## 2014-12-23 NOTE — Progress Notes (Signed)
Patient ID: ANDRY BOGDEN, male   DOB: 1985/12/21, 29 y.o.   MRN: [id number] HPI: MARTON MALIZIA is a 29 y.o. male who is here for his HIV care now.  Allergies: No Known Allergies  Vitals: Temp: 97.8 F (36.6 C) (09/01 0441) BP: 83/50 mmHg (09/01 0441) Pulse Rate: 92 (09/01 0441)  Past Medical History: Past Medical History  Diagnosis Date  . Ulcerative colitis     diagnosed 2010, left sided.  . Empyema     2010  . GI bleed   . MRSA bacteremia   . HIV (human immunodeficiency virus infection) dx'd 2010    patient never follow up with ID  . Anemia   . Pericardial effusion   . Gastroenteritis due to Cryptosporidium 12/11/2014  . Gastroenteritis due to norovirus 12/11/2014  . Syphilis 12/12/2014  . Failure to thrive in adult 12/22/2014  . Dysphagia 12/22/2014  . Right calf pain 12/22/2014  . CAP (community acquired pneumonia) 2012  . History of blood transfusion 2010    "losing blood from my rectum"    Social History: Social History   Social History  . Marital Status: Single    Spouse Name: N/A  . Number of Children: 0  . Years of Education: N/A   Occupational History  . manager [employer]    Social History Main Topics  . Smoking status: Never Smoker   . Smokeless tobacco: Never Used  . Alcohol Use: 3.6 oz/week    0 Standard drinks or equivalent, 6 Cans of beer per week     Comment: 12/22/2014 "6 beers once/ month"  . Drug Use: No  . Sexual Activity: Not Currently   Other Topics Concern  . None   Social History Narrative   Graduated from [employer]    Previous Regimen: Naive  Current Regimen: None  Labs: HIV 1 RNA QUANT (copies/mL)  Date Value  12/10/2014 75420  11/29/2008 111000*   CD4 T CELL ABS  Date Value  12/08/2014 <10 /uL*  12/02/2008 310 cmm*   HEPATITIS B SURFACE AG (no units)  Date Value  12/09/2014 Negative   HCV AB (s/co ratio)  Date Value  12/09/2014 0.1    CrCl: Estimated Creatinine Clearance: 44.4 mL/min (by C-G formula based  on Cr of 1.48).  Lipids: No results found for: CHOL, TRIG, HDL, CHOLHDL, VLDL, LDLCALC  Assessment: 29 yo who supposedly was dx in 2010 but was never in care. He prob has had HIV for a good while as his CD4 is <10 now. He has had multiple issue including syphyllis. 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 Plan is to admit him for a possible DVT also. We are going to start the [facility] process since is in clinic right now to get him on ART. We are planning on using DTG/Descovy.  Recommendations:  [facility] has been done ADAP has not been done yet Plan to use paromonomycin or nitazoxanide for cryptosporidium  Clide Cliff, PharmD Clinical Infectious Disease Pharmacist [facility] 12/23/2014, 9:36 AM

## 2014-12-24 LAB — BASIC METABOLIC PANEL
ANION GAP: 9 (ref 5–15)
BUN: 29 mg/dL — ABNORMAL HIGH (ref 6–20)
CALCIUM: 8 mg/dL — AB (ref 8.9–10.3)
CHLORIDE: 95 mmol/L — AB (ref 101–111)
CO2: 16 mmol/L — AB (ref 22–32)
Creatinine, Ser: 1.04 mg/dL (ref 0.61–1.24)
GFR calc non Af Amer: >60 mL/min (ref >60)
Glucose, Bld: 92 mg/dL (ref 65–99)
POTASSIUM: 5.7 mmol/L — AB (ref 3.5–5.1)
Sodium: 120 mmol/L — ABNORMAL LOW (ref 135–145)

## 2014-12-24 LAB — MAGNESIUM: Magnesium: 2.2 mg/dL (ref 1.7–2.4)

## 2014-12-24 NOTE — Progress Notes (Signed)
PROGRESS NOTE  Brian Adkins:[id number] DOB: 04-30-1985 DOA: 12/22/2014 PCP: No PCP Per Patient  Brief History 29 year old male with a history of HIV/AIDS diagnosed in 2010 who has never been on any anti-retroviral medications was recently discharged from [facility] on 12/12/2014 after treatment for abdominal pain and acute kidney injury, diarrhea. During that admission, CT of the abdomen and pelvis was negative for any acute findings. He was sent home with Bactrim DS daily and fluconazole. During that admission, stool studies were positive for Cryptosporidium and norovirus. His stool cultures were negative. After discharge, the patient continued to have diarrhea. He tried Imodium which offered some help, but he continued to have watery stools without hematochezia. The patient denies any fevers, chills, chest pain, shortness of breath, coughing, hemoptysis, headaches, rashes. He has had 4 bowel movements daily. He has had nausea and vomiting daily since discharge without any hematemesis or coffee grounds. The patient has also been complaining of solid food dysphagia for 1-2 weeks. He denies any travels or raw or undercooked foods. He works at [employer]. He went to see infectious disease, Dr. Daiva Eves, who requested admission for FTT.  Assessment/Plan: Dehydration/failure to thrive -Secondary to diarrhea which is secondary to Cryptosporidium and norovirus -Check C. 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 -unclear if will benefit from nitazoxanide or paromomycin--defer to ID -start imodium if okay with ID  Hyponatremia -secondary to volume depletion and diarrhea -continue IVF Right leg pain -duplex r/o DVT--neg Dysphagia -no thrush on exam -clears--tolerating-->advance to soft  diet-->tolerating -defer to ID if want to start empiric fluconazole -he has been taking once weekly fluconazole AIDS --12/08/2014--CD4 <10 --12/08/2014 HIV RNA--75420 -continue antiretrovirals  d/c--awaiting ADAP -continue Bactrim and azithromycin for prophylaxis -G6PD level--WNL Secondary late latent syphilis -The patient received 2.4 million units benzathine penicillin at [facility] -will need to restart and give 2.4MU IM q week x 3--next dose 9/8 Generalized weakness/Deconditioning -PT eval Severe protein calorie malnutrition --Continue nutritional supplementation   Procedures/Studies: Ct Abdomen Pelvis Wo Contrast  12/08/2014   CLINICAL DATA:  Left lower quadrant and bilateral flank pain. Nausea. History of ulcerative colitis.  EXAM: CT ABDOMEN AND PELVIS WITHOUT CONTRAST  TECHNIQUE: Multidetector CT imaging of the abdomen and pelvis was performed following the standard protocol without IV contrast.  COMPARISON:  None.  FINDINGS: Linear scarring in the right lung base. Left base clear. Heart is normal size. No effusions.  Paucity of intraperitoneal fat and lack of intravenous contrast somewhat limits study. Liver, gallbladder, spleen, pancreas, adrenals and kidneys have an unremarkable unenhanced appearance. No hydronephrosis.  No evidence of bowel obstruction. Stomach, large and small bowel grossly unremarkable. No visible areas of bowel wall thickening. No visible free fluid, free air or adenopathy. Urinary bladder is unremarkable. No acute bony abnormality.  IMPRESSION: Study somewhat limited by lack of intraperitoneal fat and IV contrast. No visible acute findings on this nonenhanced CT.   Electronically Signed   By: Charlett Nose M.D.   On: 12/08/2014 12:35   Dg Chest 2 View  12/08/2014   CLINICAL DATA:  Increasingly worse cough today  EXAM: CHEST  2 VIEW  COMPARISON:  Multiple prior studies including 12/02/2014  FINDINGS: The heart size and mediastinal contours are within normal limits. Both  lungs are clear. The visualized skeletal structures are unremarkable.  IMPRESSION: No  active cardiopulmonary disease.   Electronically Signed   By: Esperanza Heir M.D.   On: 12/08/2014 15:48   Dg Abd Acute W/chest  12/02/2014   CLINICAL DATA:  29 year old male with diarrhea, nausea with increase p.o. intake and mid abdominal pain cramping for the past 7 days. History of ulcerative colitis.  EXAM: DG ABDOMEN ACUTE W/ 1V CHEST  COMPARISON:  Chest x-ray 01/09/2014.  FINDINGS: Mild scarring in the periphery of the right lung base, unchanged compared to the prior examination. Lung volumes are normal. No consolidative airspace disease. No pleural effusions. No pneumothorax. No pulmonary nodule or mass noted. Pulmonary vasculature and the cardiomediastinal silhouette are within normal limits.  Air-fluid levels are present in the colon. No pathologic dilatation of small bowel or colon. No pneumoperitoneum. Calcifications projecting over the upper abdomen are favored to be pancreatic. High density material in the right lower quadrant maybe postoperative, particularly if there is prior history of partial bowel resection.  IMPRESSION: 1. Nonspecific bowel gas pattern, as above. While findings do not indicate bowel obstruction, the presence of multiple air-fluid levels in the colon is compatible with the reported history of diarrhea, and could indicate active colitis. 2. No pneumoperitoneum. 3. No radiographic evidence of acute cardiopulmonary disease.   Electronically Signed   By: Trudie Reed M.D.   On: 12/02/2014 18:42         Subjective: Patient is able to tolerate a soft diet. He denies any fevers, chills, chest pain, shortness breath, nausea, vomiting, abdominal pain, dysuria. He is still having 4 loose bowel movements daily. No hematochezia.  Objective: Filed Vitals:   12/23/14 1936 12/23/14 2119 12/24/14 0602 12/24/14 0928  BP: 101/67 98/60 100/65 124/64  Pulse: 92 82 89 84  Temp: 97.9 F (36.6  C) 97.7 F (36.5 C) 97.9 F (36.6 C) 97.1 F (36.2 C)  TempSrc: Oral Oral Oral Oral  Resp: 18 16 16 16   Weight:  42.49 kg (93 lb 10.8 oz)    SpO2: 100% 100% 100% 100%    Intake/Output Summary (Last 24 hours) at 12/24/14 1721 Last data filed at 12/24/14 0600  Gross per 24 hour  Intake 1288.33 ml  Output    400 ml  Net 888.33 ml   Weight change: 0.124 kg (4.4 oz) Exam:   General:  Pt is alert, follows commands appropriately, not in acute distress  HEENT: No icterus, No thrush, No neck mass, [place]/AT  Cardiovascular: RRR, S1/S2, no rubs, no gallops  Respiratory: CTA bilaterally, no wheezing, no crackles, no rhonchi  Abdomen: Soft/+BS, non tender, non distended, no guarding  Extremities: No edema, No lymphangitis, No petechiae, No rashes, no synovitis  Data Reviewed: Basic Metabolic Panel:  Recent Labs Lab 12/22/14 2102 12/23/14 1714 12/24/14 0459  NA 118* 120* 120*  K 3.6 4.9 5.7*  CL 87* 96* 95*  CO2 16* 13* 16*  GLUCOSE 104* 102* 92  BUN 77* 45* 29*  CREATININE 1.48* 1.06 1.04  CALCIUM 8.9 8.3* 8.0*  MG 2.2  --  2.2   Liver Function Tests:  Recent Labs Lab 12/22/14 2102  AST 66*  ALT 29  ALKPHOS 77  BILITOT 1.0  PROT 7.6  ALBUMIN 3.6    Recent Labs Lab 12/22/14 2102  LIPASE 33   No results for input(s): AMMONIA in the last 168 hours. CBC:  Recent Labs Lab 12/22/14 2102  WBC 4.8  NEUTROABS 3.6  HGB 14.1  HCT 37.2*  MCV 79.8  PLT 170   Cardiac Enzymes: No  results for input(s): CKTOTAL, CKMB, CKMBINDEX, TROPONINI in the last 168 hours. BNP: Invalid input(s): POCBNP CBG: No results for input(s): GLUCAP in the last 168 hours.  Recent Results (from the past 240 hour(s))  C difficile quick scan w PCR reflex     Status: None   Collection Time: 12/22/14 11:24 PM  Result Value Ref Range Status   C Diff antigen NEGATIVE NEGATIVE Final   C Diff toxin NEGATIVE NEGATIVE Final   C Diff interpretation Negative for toxigenic C. difficile   Final     Scheduled Meds: . [START ON 12/29/2014] azithromycin  1,200 mg Oral Q Wed  . dolutegravir  50 mg Oral Daily  . dronabinol  5 mg Oral BID AC  . emtricitabine-tenofovir AF  1 tablet Oral Daily  . enoxaparin (LOVENOX) injection  40 mg Subcutaneous Q24H  . feeding supplement  1 Container Oral TID BM  . feeding supplement (ENSURE ENLIVE)  237 mL Oral BID BM  . [START ON 12/27/2014] sulfamethoxazole-trimethoprim  1 tablet Oral Once per day on Mon Wed Fri   Continuous Infusions: . sodium chloride 100 mL/hr at 12/24/14 0553     Piccola Arico, DO  [facility] Pager [phone]  If 7PM-7AM, please contact night-coverage [email or web address] Password [email or web address] 12/24/2014, 5:21 PM   LOS: 2 days

## 2014-12-24 NOTE — Evaluation (Signed)
Physical Therapy Evaluation Patient Details Name: Brian Adkins MRN: [id number] DOB: January 18, 1986 Today's Date: 12/24/2014   History of Present Illness  Patient is a 29 yo male admitted 12/22/14 with continued N/V and diarrhea.  Patient with dehydration and FTT.  Rt calf pain tested negative for DVT.    Clinical Impression  Patient presents with problems listed below.  Will benefit from acute PT to maximize functional mobility prior to discharge home with family.  Patient with significant general weakness, impacting mobility, balance, safety.  Recommend f/u HHPT at discharge for continued therapy.    Follow Up Recommendations Home health PT;Supervision - Intermittent    Equipment Recommendations  None recommended by PT    Recommendations for Other Services       Precautions / Restrictions Precautions Precautions: None Restrictions Weight Bearing Restrictions: No      Mobility  Bed Mobility Overal bed mobility: Modified Independent             General bed mobility comments: Increased time to move to sitting due to weakness.  Transfers Overall transfer level: Needs assistance Equipment used: None Transfers: Sit to/from Stand Sit to Stand: Min guard         General transfer comment: Increased time.  Assist for balance/safety.  Ambulation/Gait Ambulation/Gait assistance: Min guard;Min assist Ambulation Distance (Feet): 24 Feet Assistive device: None Gait Pattern/deviations: Step-through pattern;Decreased step length - right;Decreased step length - left;Decreased stride length;Shuffle;Drifts right/left;Trunk flexed (Head down) Gait velocity: decreased Gait velocity interpretation: Below normal speed for age/gender General Gait Details: Patient with very slow and unsteady gait.  Drifts to both sides, but able to self correct.  Physical assist x1 due to loss of balance during turns.    Stairs            Wheelchair Mobility    Modified Rankin (Stroke Patients  Only)       Balance Overall balance assessment: Needs assistance         Standing balance support: No upper extremity supported Standing balance-Leahy Scale: Fair                               Pertinent Vitals/Pain Pain Assessment: 0-10 Pain Score: 6  Pain Location: RLE Pain Descriptors / Indicators: Aching;Sore Pain Intervention(s): Limited activity within patient's tolerance;Monitored during session;Repositioned    Home Living Family/patient expects to be discharged to:: Private residence Living Arrangements: Parent (Mother) Available Help at Discharge: Family;Available 24 hours/day (Works, but can be at home when needed.) Type of Home: House Home Access: Stairs to enter Entrance Stairs-Rails: None Secretary/administrator of Steps: 3 Home Layout: One level Home Equipment: None      Prior Function Level of Independence: Independent         Comments: Does not drive.     Hand Dominance        Extremity/Trunk Assessment   Upper Extremity Assessment: Generalized weakness           Lower Extremity Assessment: Generalized weakness      Cervical / Trunk Assessment: Other exceptions  Communication   Communication: No difficulties  Cognition Arousal/Alertness: Awake/alert Behavior During Therapy: WFL for tasks assessed/performed;Flat affect Overall Cognitive Status: Within Functional Limits for tasks assessed                      General Comments      Exercises        Assessment/Plan  PT Assessment Patient needs continued PT services  PT Diagnosis Difficulty walking;Abnormality of gait;Generalized weakness;Acute pain   PT Problem List Decreased strength;Decreased activity tolerance;Decreased balance;Decreased mobility;Decreased knowledge of use of DME;Pain  PT Treatment Interventions DME instruction;Gait training;Functional mobility training;Therapeutic activities;Therapeutic exercise;Balance training;Patient/family  education   PT Goals (Current goals can be found in the Care Plan section) Acute Rehab PT Goals Patient Stated Goal: To feel better. PT Goal Formulation: With patient Time For Goal Achievement: 12/31/14 Potential to Achieve Goals: Good    Frequency Min 3X/week   Barriers to discharge Decreased caregiver support Per patient, mother works, but can be at home with patient as needed.    Co-evaluation               End of Session Equipment Utilized During Treatment: Gait belt Activity Tolerance: Patient limited by fatigue;Patient limited by pain Patient left: in bed;with call bell/phone within reach;with bed alarm set Nurse Communication: Mobility status         Time: 4098-1191 PT Time Calculation (min) (ACUTE ONLY): 12 min   Charges:   PT Evaluation $Initial PT Evaluation Tier I: 1 Procedure     PT G CodesVena Austria 23-Jan-2015, 4:48 PM Durenda Hurt. Renaldo Fiddler, [facility] Acute Rehab Services Pager [phone]

## 2014-12-24 NOTE — Progress Notes (Signed)
INFECTIOUS DISEASE PROGRESS NOTE  ID: DMAURI ROSENOW is a 29 y.o. male with  Active Problems:   Diarrhea   Human immunodeficiency virus (HIV) disease   AIDS   Gastroenteritis due to Cryptosporidium   Failure to thrive in adult   Dysphagia   Dehydration   Protein-calorie malnutrition, severe  Subjective: Without complaints No dysphagia, +decreased appetite.  Diarrhea unchanged.   Abtx:  Anti-infectives    Start     Dose/Rate Route Frequency Ordered Stop   12/29/14 1000  azithromycin (ZITHROMAX) tablet 1,200 mg     1,200 mg Oral Every Wed 12/23/14 0819     12/27/14 0900  sulfamethoxazole-trimethoprim (BACTRIM DS,SEPTRA DS) 800-160 MG per tablet 1 tablet     1 tablet Oral Once per day on Mon Wed Fri 12/23/14 1610     12/24/14 1000  emtricitabine-tenofovir AF (DESCOVY) 200-25 MG per tablet 1 tablet     1 tablet Oral Daily 12/23/14 1746     12/23/14 1000  sulfamethoxazole-trimethoprim (BACTRIM DS,SEPTRA DS) 800-160 MG per tablet 1 tablet  Status:  Discontinued     1 tablet Oral Daily 12/22/14 1753 12/23/14 1610   12/22/14 2245  azithromycin (ZITHROMAX) tablet 1,200 mg  Status:  Discontinued     1,200 mg Oral Every 7 days 12/22/14 1753 12/23/14 0819   12/22/14 2145  dolutegravir (TIVICAY) tablet 50 mg     50 mg Oral Daily 12/22/14 2131     12/22/14 2131  emtricitabine-tenofovir (TRUVADA) 200-300 MG per tablet 1 tablet  Status:  Discontinued     1 tablet Oral Daily 12/22/14 2131 12/23/14 1746      Medications:  Scheduled: . [START ON 12/29/2014] azithromycin  1,200 mg Oral Q Wed  . dolutegravir  50 mg Oral Daily  . dronabinol  5 mg Oral BID AC  . emtricitabine-tenofovir AF  1 tablet Oral Daily  . enoxaparin (LOVENOX) injection  40 mg Subcutaneous Q24H  . feeding supplement  1 Container Oral TID BM  . feeding supplement (ENSURE ENLIVE)  237 mL Oral BID BM  . [START ON 12/27/2014] sulfamethoxazole-trimethoprim  1 tablet Oral Once per day on Mon Wed Fri    Objective: Vital  signs in last 24 hours: Temp:  [97.1 F (36.2 C)-97.9 F (36.6 C)] 97.1 F (36.2 C) (09/02 0928) Pulse Rate:  [82-92] 84 (09/02 0928) Resp:  [16-18] 16 (09/02 0928) BP: (98-124)/(60-67) 124/64 mmHg (09/02 0928) SpO2:  [100 %] 100 % (09/02 0928) Weight:  [42.49 kg (93 lb 10.8 oz)] 42.49 kg (93 lb 10.8 oz) (09/01 2119)   General appearance: alert, cooperative and no distress Resp: clear to auscultation bilaterally Cardio: regular rate and rhythm GI: normal findings: bowel sounds normal and soft, non-tender  Lab Results  Recent Labs  12/22/14 2102 12/23/14 1714 12/24/14 0459  WBC 4.8  --   --   HGB 14.1  --   --   HCT 37.2*  --   --   NA 118* 120* 120*  K 3.6 4.9 5.7*  CL 87* 96* 95*  CO2 16* 13* 16*  BUN 77* 45* 29*  CREATININE 1.48* 1.06 1.04   Liver Panel  Recent Labs  12/22/14 2102  PROT 7.6  ALBUMIN 3.6  AST 66*  ALT 29  ALKPHOS 77  BILITOT 1.0   Sedimentation Rate No results for input(s): ESRSEDRATE in the last 72 hours. C-Reactive Protein No results for input(s): CRP in the last 72 hours.  Microbiology: Recent Results (from the past 240  hour(s))  C difficile quick scan w PCR reflex     Status: None   Collection Time: 12/22/14 11:24 PM  Result Value Ref Range Status   C Diff antigen NEGATIVE NEGATIVE Final   C Diff toxin NEGATIVE NEGATIVE Final   C Diff interpretation Negative for toxigenic C. difficile  Final    Studies/Results: No results found.   Assessment/Plan: AIDS continue tivicay and descovey Genotype pending CM is working on getting him emergency HIV drug coverage  ARF Cr back to normal Suspect related to his diarrhea.  Cryptosporidium Will start him on nitazoxamide if he does not improve.   Failure to thrive nutrition eval Adv diet as tolerated  Syphillis Plan for PEN IM weekly x3 (next dose  on 9-8)  OI prophylaxis Change his bactrim to TIW due to ARF         [name] Infectious Diseases (pager) [phone] [email or web address] 12/24/2014, 12:06 PM  LOS: 2 days

## 2014-12-25 DIAGNOSIS — A529 Late syphilis, unspecified: Secondary | ICD-10-CM | POA: Insufficient documentation

## 2014-12-25 LAB — BASIC METABOLIC PANEL
ANION GAP: 8 (ref 5–15)
BUN: 16 mg/dL (ref 6–20)
CALCIUM: 7.8 mg/dL — AB (ref 8.9–10.3)
CO2: 15 mmol/L — ABNORMAL LOW (ref 22–32)
Chloride: 101 mmol/L (ref 101–111)
Creatinine, Ser: 0.83 mg/dL (ref 0.61–1.24)
GLUCOSE: 83 mg/dL (ref 65–99)
POTASSIUM: 4.9 mmol/L (ref 3.5–5.1)
SODIUM: 124 mmol/L — AB (ref 135–145)

## 2014-12-25 MED ORDER — SODIUM CHLORIDE 0.9 % IV BOLUS (SEPSIS)
1000.0000 mL | Freq: Once | INTRAVENOUS | Status: AC
  Administered 2014-12-25: 1000 mL via INTRAVENOUS

## 2014-12-25 MED ORDER — DAPSONE 100 MG PO TABS
100.0000 mg | ORAL_TABLET | Freq: Every day | ORAL | Status: DC
  Administered 2014-12-25 – 2015-01-01 (×8): 100 mg via ORAL
  Filled 2014-12-25 (×9): qty 1

## 2014-12-25 MED ORDER — FLUCONAZOLE 100 MG PO TABS
200.0000 mg | ORAL_TABLET | Freq: Every day | ORAL | Status: DC
  Administered 2014-12-25 – 2015-01-01 (×8): 200 mg via ORAL
  Filled 2014-12-25 (×8): qty 2

## 2014-12-25 NOTE — Progress Notes (Signed)
BP was 75/46 at 2200. Patient asymptomatic. PA on-call notified. New orders received for NS bolus for 1 L. Will administer and continue to monitor.

## 2014-12-25 NOTE — Progress Notes (Signed)
[facility]    Subjective: Coughing a bit more, diarrhea slighltty better, still dysphagia of liquids and solids   Antibiotics:  Anti-infectives    Start     Dose/Rate Route Frequency Ordered Stop   12/29/14 1000  azithromycin (ZITHROMAX) tablet 1,200 mg     1,200 mg Oral Every Wed 12/23/14 0819     12/27/14 0900  sulfamethoxazole-trimethoprim (BACTRIM DS,SEPTRA DS) 800-160 MG per tablet 1 tablet  Status:  Discontinued     1 tablet Oral Once per day on Mon Wed Fri 12/23/14 1610 12/25/14 1315   12/25/14 1400  dapsone tablet 100 mg     100 mg Oral Daily 12/25/14 1315     12/25/14 1400  fluconazole (DIFLUCAN) tablet 200 mg     200 mg Oral Daily 12/25/14 1315     12/24/14 1000  emtricitabine-tenofovir AF (DESCOVY) 200-25 MG per tablet 1 tablet     1 tablet Oral Daily 12/23/14 1746     12/23/14 1000  sulfamethoxazole-trimethoprim (BACTRIM DS,SEPTRA DS) 800-160 MG per tablet 1 tablet  Status:  Discontinued     1 tablet Oral Daily 12/22/14 1753 12/23/14 1610   12/22/14 2245  azithromycin (ZITHROMAX) tablet 1,200 mg  Status:  Discontinued     1,200 mg Oral Every 7 days 12/22/14 1753 12/23/14 0819   12/22/14 2145  dolutegravir (TIVICAY) tablet 50 mg     50 mg Oral Daily 12/22/14 2131     12/22/14 2131  emtricitabine-tenofovir (TRUVADA) 200-300 MG per tablet 1 tablet  Status:  Discontinued     1 tablet Oral Daily 12/22/14 2131 12/23/14 1746      Medications: Scheduled Meds: . [START ON 12/29/2014] azithromycin  1,200 mg Oral Q Wed  . dapsone  100 mg Oral Daily  . dolutegravir  50 mg Oral Daily  . dronabinol  5 mg Oral BID AC  . emtricitabine-tenofovir AF  1 tablet Oral Daily  . enoxaparin (LOVENOX) injection  40 mg Subcutaneous Q24H  . feeding supplement  1 Container Oral TID BM  . feeding supplement (ENSURE ENLIVE)  237 mL Oral BID BM  . fluconazole  200 mg Oral Daily   Continuous Infusions: . sodium chloride 100 mL/hr at 12/25/14  1443   PRN Meds:.acetaminophen **OR** acetaminophen, ondansetron **OR** ondansetron (ZOFRAN) IV    Objective: Weight change:   Intake/Output Summary (Last 24 hours) at 12/25/14 1630 Last data filed at 12/25/14 1449  Gross per 24 hour  Intake   4460 ml  Output    100 ml  Net   4360 ml   Blood pressure 99/57, pulse 77, temperature 98.1 F (36.7 C), temperature source Oral, resp. rate 18, height  (1.575 m), weight 93 lb 10.8 oz (42.49 kg), SpO2 100 %. Temp:  [97.6 F (36.4 C)-98.1 F (36.7 C)] 98.1 F (36.7 C) (09/03 0934) Pulse Rate:  [77-88] 77 (09/03 0934) Resp:  [16-19] 18 (09/03 0934) BP: (92-102)/(55-58) 99/57 mmHg (09/03 0934) SpO2:  [98 %-100 %] 100 % (09/03 0934)  Physical Exam: General: Alert and awake, oriented x3, not in any acute distress. HEENT: anicteric sclera,  EOMI CVS regular rate, normal r,  no murmur rubs or gallops Chest: fairly clear to auscultation bilaterally, no wheezing, rales or rhonchi Abdomen: soft nondistended, normal bowel sounds,  Neuro: nonfocal  CBC:  CBC Latest Ref Rng 12/22/2014 12/12/2014 12/10/2014  WBC 4.0 - 10.5 K/uL 4.8 5.6 5.3  Hemoglobin  13.0 - 17.0 g/dL 16.1 09.6 04.5  Hematocrit 39.0 - 52.0 % 37.2(L) 38.3(L) 37.8(L)  Platelets 150 - 400 K/uL 170 233 188       BMET  Recent Labs  12/24/14 0459 12/25/14 0430  NA 120* 124*  K 5.7* 4.9  CL 95* 101  CO2 16* 15*  GLUCOSE 92 83  BUN 29* 16  CREATININE 1.04 0.83  CALCIUM 8.0* 7.8*     Liver Panel   Recent Labs  12/22/14 2102  PROT 7.6  ALBUMIN 3.6  AST 66*  ALT 29  ALKPHOS 77  BILITOT 1.0       Sedimentation Rate No results for input(s): ESRSEDRATE in the last 72 hours. C-Reactive Protein No results for input(s): CRP in the last 72 hours.  Micro Results: Recent Results (from the past 720 hour(s))  C difficile quick scan w PCR reflex     Status: None   Collection Time: 12/08/14  4:38 PM  Result Value Ref Range Status   C Diff antigen NEGATIVE  NEGATIVE Final   C Diff toxin NEGATIVE NEGATIVE Final   C Diff interpretation Negative for toxigenic C. difficile  Final  MRSA PCR Screening     Status: None   Collection Time: 12/08/14  5:30 PM  Result Value Ref Range Status   MRSA by PCR NEGATIVE NEGATIVE Final    Comment:        The GeneXpert MRSA Assay (FDA approved for NASAL specimens only), is one component of a comprehensive MRSA colonization surveillance program. It is not intended to diagnose MRSA infection nor to guide or monitor treatment for MRSA infections.   Cryptosporidium Smear, Fecal     Status: None   Collection Time: 12/09/14 11:30 PM  Result Value Ref Range Status   Specimen Description STOOL  Final   Special Requests NONE  Final   Cryptosporidium Smear.   Final    CRYPTOSPORIDIUM PRESENT NO ISOSPORA OR CYCLOSPORA SEEN CRITICAL RESULT CALLED TO, READ BACK BY AND VERIFIED WITH: DR Angelique Blonder TURNER BY INGRAM A 12/13/14 220PM FAXED TO [facility] 12/14/14 BY LYONK Performed at [facility]    Report Status 12/14/2014 FINAL  Final  Stool culture     Status: None   Collection Time: 12/09/14 11:30 PM  Result Value Ref Range Status   Specimen Description STOOL  Final   Special Requests NONE  Final   Culture   Final    NO SALMONELLA, SHIGELLA, CAMPYLOBACTER, YERSINIA, OR E.COLI 0157:H7 ISOLATED Performed at [facility]    Report Status 12/14/2014 FINAL  Final  Ova and parasite examination     Status: None   Collection Time: 12/09/14 11:30 PM  Result Value Ref Range Status   Specimen Description STOOL  Final   Special Requests NONE  Final   Ova and parasites   Final    CRYPTOSPORIDIUM SPECIES CRITICAL RESULT CALLED TO, READ BACK BY AND VERIFIED WITH: DR Angelique Blonder TURNER BY INGRAM A 12/13/14 220PM FAXED TO [facility] 12/14/14 BY LYONK Performed at [facility]    Report Status 12/14/2014 FINAL  Final  C difficile quick scan w PCR reflex     Status:  None   Collection Time: 12/22/14 11:24 PM  Result Value Ref Range Status   C Diff antigen NEGATIVE NEGATIVE Final   C Diff toxin NEGATIVE NEGATIVE Final   C Diff interpretation Negative for toxigenic C. difficile  Final    Studies/Results: No results found.  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 male with  HIV/AIDS, cryptosporidial infection, diarrhea, dysphagia, failure to thrive, syphilis  #1 HIV/AIDS: continue Tivicay/descovy, OI prophylaxis with dapsone now instead of bactrim to help with pill size  #2 Dysphagia: will give him azole therapy to see ifimproves if not would ask GI to performe EGD with biospies and PCRS for HSV CMV  #3 Cryptosporidial infection: IF we can get NITazoxanide would give it to him. OK in the interim to use anti-motility agents  #4 Syphilis: needs weekly PCN  #5 FTT: appreicate nutrition consult  NOTE PTS TIVICAY SHOULD NOT BE AT SAME TIME AS ENSURE OR OTHER MAGNESIUM CALCIUM CONTAIINING supplements or vitamins  WIll put in PT/OT consults if not done yet     LOS: 3 days   Acey Lav 12/25/2014, 4:30 PM

## 2014-12-25 NOTE — Progress Notes (Addendum)
PROGRESS NOTE  Brian Adkins:[id number] DOB: 1986-02-15 DOA: 12/22/2014 PCP: No PCP Per Patient  Brief History 29 year old male with a history of HIV/AIDS diagnosed in 2010 who has never been on any anti-retroviral medications was recently discharged from [facility] on 12/12/2014 after treatment for abdominal pain and acute kidney injury, diarrhea. During that admission, CT of the abdomen and pelvis was negative for any acute findings. He was sent home with Bactrim DS daily and fluconazole. During that admission, stool studies were positive for Cryptosporidium and norovirus. His stool cultures were negative. After discharge, the patient continued to have diarrhea. He tried Imodium which offered some help, but he continued to have watery stools without hematochezia. The patient denies any fevers, chills, chest pain, shortness of breath, coughing, hemoptysis, headaches, rashes. He has had 4 bowel movements daily. He has had nausea and vomiting daily since discharge without any hematemesis or coffee grounds. The patient has also been complaining of solid food dysphagia for 1-2 weeks. He denies any travels or raw or undercooked foods. He works at [employer]. He went to see infectious disease, Dr. Daiva Eves, who requested admission for FTT.  Assessment/Plan: Dehydration/failure to thrive -Secondary to diarrhea which is secondary to Cryptosporidium and norovirus -Check C. 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 -unclear if will benefit from nitazoxanide or paromomycin--defer to ID -start imodium if okay with ID  AKI -improved with IVF -continue IVF  Hyponatremia -secondary to volume depletion and diarrhea -continue IVF -slowly improving  Nongapped metabolic acidosis -secondary to  diarrhea  Dysphagia -no thrush on exam -clears--tolerating-->advance to soft diet-->tolerating but having intermitten dysphagia -empiric fluconazole per ID -he has been taking once weekly fluconazole  AIDS --12/08/2014--CD4 <10 --12/08/2014 HIV RNA--75420 -continue antiretrovirals d/c--awaiting [facility] -continue dapsone and azithromycin for prophylaxis -G6PD level--WNL Secondary late latent syphilis -The patient received 2.4 million units benzathine penicillin at [facility] -will need to restart and give 2.4MU IM q week x 3--next dose 9/8 Generalized weakness/Deconditioning -PT eval-->HHPT Severe protein calorie malnutrition --Continue nutritional supplementation -continue marinol Right leg pain -duplex r/o DVT--neg   Procedures/Studies: Ct Abdomen Pelvis Wo Contrast  12/08/2014   CLINICAL DATA:  Left lower quadrant and bilateral flank pain. Nausea. History of ulcerative colitis.  EXAM: CT ABDOMEN AND PELVIS WITHOUT CONTRAST  TECHNIQUE: Multidetector CT imaging of the abdomen and pelvis was performed following the standard protocol without IV contrast.  COMPARISON:  None.  FINDINGS: Linear scarring in the right lung base. Left base clear. Heart is normal size. No effusions.  Paucity of intraperitoneal fat and lack of intravenous contrast somewhat limits study. Liver, gallbladder, spleen, pancreas, adrenals and kidneys have an unremarkable unenhanced appearance. No hydronephrosis.  No evidence of bowel obstruction. Stomach, large and small bowel grossly unremarkable. No visible areas of bowel wall thickening. No visible free fluid, free air or adenopathy. Urinary bladder is unremarkable. No acute bony abnormality.  IMPRESSION: Study somewhat limited by lack of intraperitoneal fat and IV contrast. No visible acute findings on this nonenhanced CT.   Electronically Signed   By: Charlett Nose M.D.   On: 12/08/2014 12:35   Dg Chest 2 View  12/08/2014   CLINICAL DATA:  Increasingly worse cough today   EXAM: CHEST  2 VIEW  COMPARISON:  Multiple prior studies including 12/02/2014  FINDINGS: The heart size and mediastinal  contours are within normal limits. Both lungs are clear. The visualized skeletal structures are unremarkable.  IMPRESSION: No active cardiopulmonary disease.   Electronically Signed   By: Esperanza Heir M.D.   On: 12/08/2014 15:48   Dg Abd Acute W/chest  12/02/2014   CLINICAL DATA:  29 year old male with diarrhea, nausea with increase p.o. intake and mid abdominal pain cramping for the past 7 days. History of ulcerative colitis.  EXAM: DG ABDOMEN ACUTE W/ 1V CHEST  COMPARISON:  Chest x-ray 01/09/2014.  FINDINGS: Mild scarring in the periphery of the right lung base, unchanged compared to the prior examination. Lung volumes are normal. No consolidative airspace disease. No pleural effusions. No pneumothorax. No pulmonary nodule or mass noted. Pulmonary vasculature and the cardiomediastinal silhouette are within normal limits.  Air-fluid levels are present in the colon. No pathologic dilatation of small bowel or colon. No pneumoperitoneum. Calcifications projecting over the upper abdomen are favored to be pancreatic. High density material in the right lower quadrant maybe postoperative, particularly if there is prior history of partial bowel resection.  IMPRESSION: 1. Nonspecific bowel gas pattern, as above. While findings do not indicate bowel obstruction, the presence of multiple air-fluid levels in the colon is compatible with the reported history of diarrhea, and could indicate active colitis. 2. No pneumoperitoneum. 3. No radiographic evidence of acute cardiopulmonary disease.   Electronically Signed   By: Trudie Reed M.D.   On: 12/02/2014 18:42         Subjective: Patient states that he has more substance to his stool. Still having 3-4 loose bowel movements daily. Denies any fevers, chills, chest pain, nausea, vomiting, abdominal pain, dysuria, hematuria. No hematochezia or  melena. No headaches, visual disturbance, neck pain.  Objective: Filed Vitals:   12/24/14 2041 12/25/14 0500 12/25/14 0800 12/25/14 0934  BP: 92/58 92/57  99/57  Pulse: 79 88  77  Temp: 97.8 F (36.6 C) 97.6 F (36.4 C)  98.1 F (36.7 C)  TempSrc:    Oral  Resp: 18 19  18   Height:   5\' 2"  (1.575 m)   Weight:      SpO2: 100% 100%  100%    Intake/Output Summary (Last 24 hours) at 12/25/14 1536 Last data filed at 12/25/14 1449  Gross per 24 hour  Intake   4460 ml  Output    100 ml  Net   4360 ml   Weight change:  Exam:   General:  Pt is alert, follows commands appropriately, not in acute distress  HEENT: No icterus, No thrush, No neck mass, [place]/AT  Cardiovascular: RRR, S1/S2, no rubs, no gallops  Respiratory: CTA bilaterally, no wheezing, no crackles, no rhonchi  Abdomen: Soft/+BS, non tender, non distended, no guarding; no HSM  Extremities: No edema, No lymphangitis, No petechiae, No rashes, no synovitis  Data Reviewed: Basic Metabolic Panel:  Recent Labs Lab 12/22/14 2102 12/23/14 1714 12/24/14 0459 12/25/14 0430  NA 118* 120* 120* 124*  K 3.6 4.9 5.7* 4.9  CL 87* 96* 95* 101  CO2 16* 13* 16* 15*  GLUCOSE 104* 102* 92 83  BUN 77* 45* 29* 16  CREATININE 1.48* 1.06 1.04 0.83  CALCIUM 8.9 8.3* 8.0* 7.8*  MG 2.2  --  2.2  --    Liver Function Tests:  Recent Labs Lab 12/22/14 2102  AST 66*  ALT 29  ALKPHOS 77  BILITOT 1.0  PROT 7.6  ALBUMIN 3.6    Recent Labs Lab 12/22/14 2102  LIPASE 33  No results for input(s): AMMONIA in the last 168 hours. CBC:  Recent Labs Lab 12/22/14 2102  WBC 4.8  NEUTROABS 3.6  HGB 14.1  HCT 37.2*  MCV 79.8  PLT 170   Cardiac Enzymes: No results for input(s): CKTOTAL, CKMB, CKMBINDEX, TROPONINI in the last 168 hours. BNP: Invalid input(s): POCBNP CBG: No results for input(s): GLUCAP in the last 168 hours.  Recent Results (from the past 240 hour(s))  C difficile quick scan w PCR reflex     Status:  None   Collection Time: 12/22/14 11:24 PM  Result Value Ref Range Status   C Diff antigen NEGATIVE NEGATIVE Final   C Diff toxin NEGATIVE NEGATIVE Final   C Diff interpretation Negative for toxigenic C. difficile  Final     Scheduled Meds: . [START ON 12/29/2014] azithromycin  1,200 mg Oral Q Wed  . dapsone  100 mg Oral Daily  . dolutegravir  50 mg Oral Daily  . dronabinol  5 mg Oral BID AC  . emtricitabine-tenofovir AF  1 tablet Oral Daily  . enoxaparin (LOVENOX) injection  40 mg Subcutaneous Q24H  . feeding supplement  1 Container Oral TID BM  . feeding supplement (ENSURE ENLIVE)  237 mL Oral BID BM  . fluconazole  200 mg Oral Daily   Continuous Infusions: . sodium chloride 100 mL/hr at 12/25/14 1443     Crescent Gotham, DO  [facility] Pager [phone]  If 7PM-7AM, please contact night-coverage [email or web address] Password [email or web address] 12/25/2014, 3:36 PM   LOS: 3 days

## 2014-12-26 DIAGNOSIS — I959 Hypotension, unspecified: Secondary | ICD-10-CM

## 2014-12-26 LAB — CBC WITH DIFFERENTIAL/PLATELET
BASOS ABS: 0 10*3/uL (ref 0.0–0.1)
Basophils Relative: 0 % (ref 0–1)
EOS ABS: 0 10*3/uL (ref 0.0–0.7)
Eosinophils Relative: 1 % (ref 0–5)
HCT: 28.8 % — ABNORMAL LOW (ref 39.0–52.0)
Hemoglobin: 10.2 g/dL — ABNORMAL LOW (ref 13.0–17.0)
LYMPHS PCT: 36 % (ref 12–46)
Lymphs Abs: 0.8 10*3/uL (ref 0.7–4.0)
MCH: 29.1 pg (ref 26.0–34.0)
MCHC: 35.4 g/dL (ref 30.0–36.0)
MCV: 82.1 fL (ref 78.0–100.0)
MONOS PCT: 10 % (ref 3–12)
Monocytes Absolute: 0.2 10*3/uL (ref 0.1–1.0)
NEUTROS ABS: 1.1 10*3/uL — AB (ref 1.7–7.7)
NEUTROS PCT: 53 % (ref 43–77)
PLATELETS: 155 10*3/uL (ref 150–400)
RBC: 3.51 MIL/uL — AB (ref 4.22–5.81)
RDW: 13.8 % (ref 11.5–15.5)
WBC: 2.1 10*3/uL — AB (ref 4.0–10.5)

## 2014-12-26 LAB — COMPREHENSIVE METABOLIC PANEL
ALBUMIN: 2.3 g/dL — AB (ref 3.5–5.0)
ALT: 25 U/L (ref 17–63)
AST: 60 U/L — ABNORMAL HIGH (ref 15–41)
Alkaline Phosphatase: 57 U/L (ref 38–126)
Anion gap: 6 (ref 5–15)
BUN: 11 mg/dL (ref 6–20)
CHLORIDE: 109 mmol/L (ref 101–111)
CO2: 16 mmol/L — AB (ref 22–32)
CREATININE: 0.86 mg/dL (ref 0.61–1.24)
Calcium: 7.6 mg/dL — ABNORMAL LOW (ref 8.9–10.3)
GFR calc non Af Amer: >60 mL/min (ref >60)
Glucose, Bld: 88 mg/dL (ref 65–99)
Potassium: 2.8 mmol/L — ABNORMAL LOW (ref 3.5–5.1)
SODIUM: 131 mmol/L — AB (ref 135–145)
Total Bilirubin: 0.6 mg/dL (ref 0.3–1.2)
Total Protein: 5.1 g/dL — ABNORMAL LOW (ref 6.5–8.1)

## 2014-12-26 LAB — LACTIC ACID, PLASMA
LACTIC ACID, VENOUS: 0.8 mmol/L (ref 0.5–2.0)
LACTIC ACID, VENOUS: 0.7 mmol/L (ref 0.5–2.0)

## 2014-12-26 MED ORDER — POTASSIUM CHLORIDE 20 MEQ/15ML (10%) PO SOLN
40.0000 meq | Freq: Once | ORAL | Status: AC
  Administered 2014-12-26: 40 meq via ORAL
  Filled 2014-12-26: qty 30

## 2014-12-26 MED ORDER — POTASSIUM CHLORIDE 10 MEQ/100ML IV SOLN
10.0000 meq | INTRAVENOUS | Status: AC
  Administered 2014-12-26 (×4): 10 meq via INTRAVENOUS
  Filled 2014-12-26 (×4): qty 100

## 2014-12-26 MED ORDER — SODIUM CHLORIDE 0.9 % IV BOLUS (SEPSIS)
500.0000 mL | Freq: Once | INTRAVENOUS | Status: AC
  Administered 2014-12-26: 500 mL via INTRAVENOUS

## 2014-12-26 NOTE — Progress Notes (Signed)
[facility]    Subjective: Stool is" thickening up  a little"   Antibiotics:  Anti-infectives    Start     Dose/Rate Route Frequency Ordered Stop   12/29/14 1000  azithromycin (ZITHROMAX) tablet 1,200 mg     1,200 mg Oral Every Wed 12/23/14 0819     12/27/14 0900  sulfamethoxazole-trimethoprim (BACTRIM DS,SEPTRA DS) 800-160 MG per tablet 1 tablet  Status:  Discontinued     1 tablet Oral Once per day on Mon Wed Fri 12/23/14 1610 12/25/14 1315   12/25/14 1400  dapsone tablet 100 mg     100 mg Oral Daily 12/25/14 1315     12/25/14 1400  fluconazole (DIFLUCAN) tablet 200 mg     200 mg Oral Daily 12/25/14 1315     12/24/14 1000  emtricitabine-tenofovir AF (DESCOVY) 200-25 MG per tablet 1 tablet     1 tablet Oral Daily 12/23/14 1746     12/23/14 1000  sulfamethoxazole-trimethoprim (BACTRIM DS,SEPTRA DS) 800-160 MG per tablet 1 tablet  Status:  Discontinued     1 tablet Oral Daily 12/22/14 1753 12/23/14 1610   12/22/14 2245  azithromycin (ZITHROMAX) tablet 1,200 mg  Status:  Discontinued     1,200 mg Oral Every 7 days 12/22/14 1753 12/23/14 0819   12/22/14 2145  dolutegravir (TIVICAY) tablet 50 mg     50 mg Oral Daily 12/22/14 2131     12/22/14 2131  emtricitabine-tenofovir (TRUVADA) 200-300 MG per tablet 1 tablet  Status:  Discontinued     1 tablet Oral Daily 12/22/14 2131 12/23/14 1746      Medications: Scheduled Meds: . [START ON 12/29/2014] azithromycin  1,200 mg Oral Q Wed  . dapsone  100 mg Oral Daily  . dolutegravir  50 mg Oral Daily  . dronabinol  5 mg Oral BID AC  . emtricitabine-tenofovir AF  1 tablet Oral Daily  . enoxaparin (LOVENOX) injection  40 mg Subcutaneous Q24H  . feeding supplement  1 Container Oral TID BM  . feeding supplement (ENSURE ENLIVE)  237 mL Oral BID BM  . fluconazole  200 mg Oral Daily   Continuous Infusions: . sodium chloride 100 mL/hr at 12/26/14 1313   PRN Meds:.acetaminophen **OR** acetaminophen,  ondansetron **OR** ondansetron (ZOFRAN) IV    Objective: Weight change:   Intake/Output Summary (Last 24 hours) at 12/26/14 1548 Last data filed at 12/26/14 1449  Gross per 24 hour  Intake   4460 ml  Output    326 ml  Net   4134 ml   Blood pressure 95/59, pulse 95, temperature 97.6 F (36.4 C), temperature source Oral, resp. rate 17, height 5\' 2"  (1.575 m), weight 96 lb (43.545 kg), SpO2 100 %. Temp:  [97.5 F (36.4 C)-97.6 F (36.4 C)] 97.6 F (36.4 C) (09/04 0829) Pulse Rate:  [79-95] 95 (09/04 0829) Resp:  [16-17] 17 (09/04 0829) BP: (75-95)/(44-59) 95/59 mmHg (09/04 0829) SpO2:  [99 %-100 %] 100 % (09/04 0829) Weight:  [96 lb (43.545 kg)] 96 lb (43.545 kg) (09/03 2206)  Physical Exam: General: Alert and awake, oriented x3, not in any acute distress. HEENT: anicteric sclera,   CVS regular rate, normal r,  no murmur rubs or gallops Chest: fairly clear to auscultation bilaterally, no wheezing, rales or rhonchi Abdomen: soft nondistended, normal bowel sounds,  Neuro: nonfocal  CBC:  CBC Latest Ref Rng 12/26/2014 12/22/2014 12/12/2014  WBC 4.0 - 10.5 K/uL 2.1(L)  4.8 5.6  Hemoglobin 13.0 - 17.0 g/dL 10.2(L) 14.1 14.1  Hematocrit 39.0 - 52.0 % 28.8(L) 37.2(L) 38.3(L)  Platelets 150 - 400 K/uL 155 170 233       BMET  Recent Labs  12/25/14 0430 12/26/14 0325  NA 124* 131*  K 4.9 2.8*  CL 101 109  CO2 15* 16*  GLUCOSE 83 88  BUN 16 11  CREATININE 0.83 0.86  CALCIUM 7.8* 7.6*     Liver Panel   Recent Labs  12/26/14 0325  PROT 5.1*  ALBUMIN 2.3*  AST 60*  ALT 25  ALKPHOS 57  BILITOT 0.6       Sedimentation Rate No results for input(s): ESRSEDRATE in the last 72 hours. C-Reactive Protein No results for input(s): CRP in the last 72 hours.  Micro Results: Recent Results (from the past 720 hour(s))  C difficile quick scan w PCR reflex     Status: None   Collection Time: 12/08/14  4:38 PM  Result Value Ref Range Status   C Diff antigen NEGATIVE  NEGATIVE Final   C Diff toxin NEGATIVE NEGATIVE Final   C Diff interpretation Negative for toxigenic C. difficile  Final  MRSA PCR Screening     Status: None   Collection Time: 12/08/14  5:30 PM  Result Value Ref Range Status   MRSA by PCR NEGATIVE NEGATIVE Final    Comment:        The GeneXpert MRSA Assay (FDA approved for NASAL specimens only), is one component of a comprehensive MRSA colonization surveillance program. It is not intended to diagnose MRSA infection nor to guide or monitor treatment for MRSA infections.   Cryptosporidium Smear, Fecal     Status: None   Collection Time: 12/09/14 11:30 PM  Result Value Ref Range Status   Specimen Description STOOL  Final   Special Requests NONE  Final   Cryptosporidium Smear.   Final    CRYPTOSPORIDIUM PRESENT NO ISOSPORA OR CYCLOSPORA SEEN CRITICAL RESULT CALLED TO, READ BACK BY AND VERIFIED WITH: DR Angelique Blonder TURNER BY INGRAM A 12/13/14 220PM FAXED TO [facility] 12/14/14 BY LYONK Performed at [facility]    Report Status 12/14/2014 FINAL  Final  Stool culture     Status: None   Collection Time: 12/09/14 11:30 PM  Result Value Ref Range Status   Specimen Description STOOL  Final   Special Requests NONE  Final   Culture   Final    NO SALMONELLA, SHIGELLA, CAMPYLOBACTER, YERSINIA, OR E.COLI 0157:[id number] ISOLATED Performed at [facility]    Report Status 12/14/2014 FINAL  Final  Ova and parasite examination     Status: None   Collection Time: 12/09/14 11:30 PM  Result Value Ref Range Status   Specimen Description STOOL  Final   Special Requests NONE  Final   Ova and parasites   Final    CRYPTOSPORIDIUM SPECIES CRITICAL RESULT CALLED TO, READ BACK BY AND VERIFIED WITH: DR Angelique Blonder TURNER BY INGRAM A 12/13/14 220PM FAXED TO [facility] 12/14/14 BY LYONK Performed at [facility]    Report Status 12/14/2014 FINAL  Final  C difficile quick scan w PCR reflex     Status:  None   Collection Time: 12/22/14 11:24 PM  Result Value Ref Range Status   C Diff antigen NEGATIVE NEGATIVE Final   C Diff toxin NEGATIVE NEGATIVE Final   C Diff interpretation Negative for toxigenic C. difficile  Final    Studies/Results:  No results found.    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 male with  HIV/AIDS, cryptosporidial infection, diarrhea, dysphagia, failure to thrive, syphilis  #1 HIV/AIDS: continue Tivicay/descovy, OI prophylaxis with dapsone now instead of bactrim to help with pill size  #2 Dysphagia: will give him azole therapy to see ifimproves if not would ask GI to performe EGD with biospies and PCRS for HSV CMV  #3 Cryptosporidial infection: Iwould try to get NITazoxanide would give it to him. OK in the interim to use anti-motility agents  #4 Syphilis: needs weekly PCN  #5 FTT: appreicate nutrition consult, PT, OT  NOTE PTS TIVICAY SHOULD NOT BE AT SAME TIME AS ENSURE OR OTHER MAGNESIUM CALCIUM CONTAIINING supplements or vitamins  WIll put in PT/OT consults if not done yet  #6 Hypotension: likely due to his diarrhea, frailty, underweight     LOS: 4 days   Acey Lav 12/26/2014, 3:48 PM

## 2014-12-26 NOTE — Progress Notes (Signed)
Patient stated that k+ run was burning  (even with fluids connected at 100cc/hr)- K+ has been turned down to 75cc/hr

## 2014-12-26 NOTE — Progress Notes (Signed)
PROGRESS NOTE  KRISTIN LAMAGNA WJX:[id number] DOB: 07-21-1985 DOA: 12/22/2014 PCP: No PCP Per Patient  Brief History 29 year old male with a history of HIV/AIDS diagnosed in 2010 who has never been on any anti-retroviral medications was recently discharged from [facility] on 12/12/2014 after treatment for abdominal pain and acute kidney injury, diarrhea. During that admission, CT of the abdomen and pelvis was negative for any acute findings. He was sent home with Bactrim DS daily and fluconazole. During that admission, stool studies were positive for Cryptosporidium and norovirus. His stool cultures were negative. After discharge, the patient continued to have diarrhea. He tried Imodium which offered some help, but he continued to have watery stools without hematochezia. The patient denies any fevers, chills, chest pain, shortness of breath, coughing, hemoptysis, headaches, rashes. He has had 4 bowel movements daily. He has had nausea and vomiting daily since discharge without any hematemesis or coffee grounds. The patient has also been complaining of solid food dysphagia for 1-2 weeks. He denies any travels or raw or undercooked foods. He works at [employer]. He went to see infectious disease, Dr. Daiva Eves, who requested admission for FTT.  Assessment/Plan: Dehydration/failure to thrive -Secondary to diarrhea which is secondary to Cryptosporidium and norovirus -Check C. 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 -unclear if will benefit from nitazoxanide or paromomycin--defer to ID -start imodium if okay with ID  AKI -improved with IVF -continue IVF  Hyponatremia -secondary to volume depletion and diarrhea -continue IVF -slowly improving  Nongapped metabolic acidosis -secondary to  diarrhea  Dysphagia -no thrush on exam -clears--tolerating-->advance to soft diet-->tolerating but having intermitten dysphagia -empiric fluconazole per ID -he has been taking once weekly fluconazole Hypokalemia -replete AIDS --12/08/2014--CD4 <10 --12/08/2014 HIV RNA--75420 -continue antiretrovirals d/c--awaiting ADAP -continue dapsone and azithromycin for prophylaxis -G6PD level--WNL Secondary late latent syphilis -The patient received 2.4 million units benzathine penicillin at [facility] -will need to restart and give 2.4MU IM q week x 3--next dose 9/8 Generalized weakness/Deconditioning -PT eval-->HHPT Severe protein calorie malnutrition --Continue nutritional supplementation -continue marinol Right leg pain -duplex r/o DVT--neg   Dispo--Home with HHPT pending clearance by ID    Procedures/Studies: Ct Abdomen Pelvis Wo Contrast  12/08/2014   CLINICAL DATA:  Left lower quadrant and bilateral flank pain. Nausea. History of ulcerative colitis.  EXAM: CT ABDOMEN AND PELVIS WITHOUT CONTRAST  TECHNIQUE: Multidetector CT imaging of the abdomen and pelvis was performed following the standard protocol without IV contrast.  COMPARISON:  None.  FINDINGS: Linear scarring in the right lung base. Left base clear. Heart is normal size. No effusions.  Paucity of intraperitoneal fat and lack of intravenous contrast somewhat limits study. Liver, gallbladder, spleen, pancreas, adrenals and kidneys have an unremarkable unenhanced appearance. No hydronephrosis.  No evidence of bowel obstruction. Stomach, large and small bowel grossly unremarkable. No visible areas of bowel wall thickening. No visible free fluid, free air or adenopathy. Urinary bladder is unremarkable. No acute bony abnormality.  IMPRESSION: Study somewhat limited by lack of intraperitoneal fat and IV contrast. No visible acute findings on this nonenhanced CT.   Electronically Signed   By: Charlett Nose M.D.   On: 12/08/2014 12:35   Dg  Chest 2 View  12/08/2014   CLINICAL DATA:  Increasingly worse cough today  EXAM: CHEST  2 VIEW  COMPARISON:  Multiple  prior studies including 12/02/2014  FINDINGS: The heart size and mediastinal contours are within normal limits. Both lungs are clear. The visualized skeletal structures are unremarkable.  IMPRESSION: No active cardiopulmonary disease.   Electronically Signed   By: Esperanza Heir M.D.   On: 12/08/2014 15:48   Dg Abd Acute W/chest  12/02/2014   CLINICAL DATA:  29 year old male with diarrhea, nausea with increase p.o. intake and mid abdominal pain cramping for the past 7 days. History of ulcerative colitis.  EXAM: DG ABDOMEN ACUTE W/ 1V CHEST  COMPARISON:  Chest x-ray 01/09/2014.  FINDINGS: Mild scarring in the periphery of the right lung base, unchanged compared to the prior examination. Lung volumes are normal. No consolidative airspace disease. No pleural effusions. No pneumothorax. No pulmonary nodule or mass noted. Pulmonary vasculature and the cardiomediastinal silhouette are within normal limits.  Air-fluid levels are present in the colon. No pathologic dilatation of small bowel or colon. No pneumoperitoneum. Calcifications projecting over the upper abdomen are favored to be pancreatic. High density material in the right lower quadrant maybe postoperative, particularly if there is prior history of partial bowel resection.  IMPRESSION: 1. Nonspecific bowel gas pattern, as above. While findings do not indicate bowel obstruction, the presence of multiple air-fluid levels in the colon is compatible with the reported history of diarrhea, and could indicate active colitis. 2. No pneumoperitoneum. 3. No radiographic evidence of acute cardiopulmonary disease.   Electronically Signed   By: Trudie Reed M.D.   On: 12/02/2014 18:42        Subjective: Patient continues of loose stool but states that there is substance to it now. Denies any fevers, chills, chest pain, short of breath,  vomiting, bone pain, dysuria, hematuria. No hematochezia or melena.  Objective: Filed Vitals:   12/26/14 0117 12/26/14 0208 12/26/14 0548 12/26/14 0829  BP: 81/45 79/44 85/47  95/59  Pulse: 83  79 95  Temp:   97.5 F (36.4 C) 97.6 F (36.4 C)  TempSrc:    Oral  Resp:   16 17  Height:      Weight:      SpO2:   99% 100%    Intake/Output Summary (Last 24 hours) at 12/26/14 1727 Last data filed at 12/26/14 1449  Gross per 24 hour  Intake   4460 ml  Output    326 ml  Net   4134 ml   Weight change:  Exam:   General:  Pt is alert, follows commands appropriately, not in acute distress  HEENT: No icterus, No thrush, No neck mass, [place]/AT  Cardiovascular: RRR, S1/S2, no rubs, no gallops  Respiratory: CTA bilaterally, no wheezing, no crackles, no rhonchi  Abdomen: Soft/+BS, non tender, non distended, no guarding  Extremities: No edema, No lymphangitis, No petechiae, No rashes, no synovitis  Data Reviewed: Basic Metabolic Panel:  Recent Labs Lab 12/22/14 2102 12/23/14 1714 12/24/14 0459 12/25/14 0430 12/26/14 0325  NA 118* 120* 120* 124* 131*  K 3.6 4.9 5.7* 4.9 2.8*  CL 87* 96* 95* 101 109  CO2 16* 13* 16* 15* 16*  GLUCOSE 104* 102* 92 83 88  BUN 77* 45* 29* 16 11  CREATININE 1.48* 1.06 1.04 0.83 0.86  CALCIUM 8.9 8.3* 8.0* 7.8* 7.6*  MG 2.2  --  2.2  --   --    Liver Function Tests:  Recent Labs Lab 12/22/14 2102 12/26/14 0325  AST 66* 60*  ALT 29 25  ALKPHOS 77 57  BILITOT 1.0 0.6  PROT 7.6 5.1*  ALBUMIN 3.6 2.3*    Recent Labs Lab 12/22/14 2102  LIPASE 33   No results for input(s): AMMONIA in the last 168 hours. CBC:  Recent Labs Lab 12/22/14 2102 12/26/14 0325  WBC 4.8 2.1*  NEUTROABS 3.6 1.1*  HGB 14.1 10.2*  HCT 37.2* 28.8*  MCV 79.8 82.1  PLT 170 155   Cardiac Enzymes: No results for input(s): CKTOTAL, CKMB, CKMBINDEX, TROPONINI in the last 168 hours. BNP: Invalid input(s): POCBNP CBG: No results for input(s): GLUCAP in the  last 168 hours.  Recent Results (from the past 240 hour(s))  C difficile quick scan w PCR reflex     Status: None   Collection Time: 12/22/14 11:24 PM  Result Value Ref Range Status   C Diff antigen NEGATIVE NEGATIVE Final   C Diff toxin NEGATIVE NEGATIVE Final   C Diff interpretation Negative for toxigenic C. difficile  Final     Scheduled Meds: . [START ON 12/29/2014] azithromycin  1,200 mg Oral Q Wed  . dapsone  100 mg Oral Daily  . dolutegravir  50 mg Oral Daily  . dronabinol  5 mg Oral BID AC  . emtricitabine-tenofovir AF  1 tablet Oral Daily  . enoxaparin (LOVENOX) injection  40 mg Subcutaneous Q24H  . feeding supplement  1 Container Oral TID BM  . feeding supplement (ENSURE ENLIVE)  237 mL Oral BID BM  . fluconazole  200 mg Oral Daily  . potassium chloride  10 mEq Intravenous Q1 Hr x 4  . potassium chloride  40 mEq Oral Once   Continuous Infusions: . sodium chloride 100 mL/hr at 12/26/14 1313     Lash Matulich, DO  [facility] Pager [phone]  If 7PM-7AM, please contact night-coverage [email or web address] Password [email or web address] 12/26/2014, 5:27 PM   LOS: 4 days

## 2014-12-26 NOTE — Progress Notes (Signed)
PA notified regarding low BP of 81/45 following 1.5L bolus of NS. New orders received. Will continue to monitor.

## 2014-12-26 NOTE — Progress Notes (Signed)
Patient BP 76/46. Repeated BP with smaller cuff 84/47. PA notified. 500cc bolus ordered and infused. Will continue to monitor.

## 2014-12-26 NOTE — Evaluation (Signed)
Occupational Therapy Evaluation Patient Details Name: Brian Adkins MRN: [id number] DOB: 07/07/1985 Today's Date: 12/26/2014    History of Present Illness  Patient is a 29 yo male admitted 12/22/14 with continued N/V and diarrhea secondary to cryptosporidium and norovirus. Patient with dehydration and FTT. Rt calf pain tested negative for DVT. PMH includes HIV/AIDS, CAP, Syphilis, pericardial effusion, MRSA, Ulcerative colitis, empyema, anemia    Clinical Impression   Pt admitted with above. He demonstrates the below listed deficits and will benefit from continued OT to maximize safety and independence with BADLs.  PT presents to OT with generalized weakness/deconditioning.  Currently, he requires min A for ADLs and fatigues quickly.  Will continue to follow. No follow up OT recommended at discharge.       Follow Up Recommendations  No OT follow up;Supervision - Intermittent    Equipment Recommendations  Tub/shower seat    Recommendations for Other Services       Precautions / Restrictions Precautions Precautions: Fall      Mobility Bed Mobility Overal bed mobility: Modified Independent                Transfers Overall transfer level: Needs assistance Equipment used: None Transfers: Sit to/from Stand;Stand Pivot Transfers Sit to Stand: Min guard Stand pivot transfers: Min assist       General transfer comment: Pt requires occasional min A for balance     Balance Overall balance assessment: Needs assistance Sitting-balance support: Feet supported Sitting balance-Leahy Scale: Good     Standing balance support: No upper extremity supported;During functional activity Standing balance-Leahy Scale: Poor Standing balance comment: Pt with LOB requiring min a to recover                             ADL Overall ADL's : Needs assistance/impaired Eating/Feeding: Independent   Grooming: Wash/dry hands;Wash/dry face;Oral care;Brushing hair;Applying  deodorant;Min guard;Standing   Upper Body Bathing: Set up;Sitting   Lower Body Bathing: Minimal assistance;Sit to/from stand   Upper Body Dressing : Set up;Sitting   Lower Body Dressing: Minimal assistance;Sit to/from stand   Toilet Transfer: Minimal assistance;Ambulation;Comfort height toilet   Toileting- Clothing Manipulation and Hygiene: Sit to/from stand;Minimal assistance       Functional mobility during ADLs: Minimal assistance General ADL Comments: Pt requires min a for balance      Vision     Perception     Praxis      Pertinent Vitals/Pain Pain Assessment: No/denies pain     Hand Dominance Right   Extremity/Trunk Assessment Upper Extremity Assessment Upper Extremity Assessment: Generalized weakness   Lower Extremity Assessment Lower Extremity Assessment: Generalized weakness       Communication Communication Communication: No difficulties   Cognition Arousal/Alertness: Awake/alert Behavior During Therapy: Flat affect (Pt does not make eye contact ) Overall Cognitive Status: Within Functional Limits for tasks assessed                     General Comments       Exercises       Shoulder Instructions      Home Living Family/patient expects to be discharged to:: Private residence Living Arrangements: Parent (Mother) Available Help at Discharge: Family;Available 24 hours/day (Works, but can be at home when needed.) Type of Home: House Home Access: Stairs to enter [employer] of Steps: 3 Entrance Stairs-Rails: None Home Layout: One level     Bathroom Shower/Tub: Tub/shower unit;Curtain Shower/tub characteristics:  Curtain       Home Equipment: None          Prior Functioning/Environment Level of Independence: Independent        Comments: Does not drive.    OT Diagnosis: Generalized weakness   OT Problem List: Decreased strength;Decreased activity tolerance;Impaired balance (sitting and/or standing);Decreased  safety awareness;Decreased knowledge of use of DME or AE   OT Treatment/Interventions: Self-care/ADL training;Neuromuscular education;DME and/or AE instruction;Therapeutic activities;Patient/family education;Balance training    OT Goals(Current goals can be found in the care plan section) Acute Rehab OT Goals Patient Stated Goal: to feel better  OT Goal Formulation: With patient Time For Goal Achievement: 01/09/15 Potential to Achieve Goals: Good ADL Goals Pt Will Perform Grooming: with modified independence;standing Pt Will Perform Upper Body Bathing: with modified independence;sitting;standing Pt Will Perform Lower Body Bathing: with modified independence;sit to/from stand Pt Will Perform Upper Body Dressing: with modified independence;sitting Pt Will Perform Lower Body Dressing: with modified independence;sit to/from stand Pt Will Transfer to Toilet: with modified independence;ambulating;regular height toilet;bedside commode;grab bars Pt Will Perform Toileting - Clothing Manipulation and hygiene: with modified independence;sit to/from stand Pt Will Perform Tub/Shower Transfer: Tub transfer;with modified independence;ambulating;shower seat  OT Frequency: Min 2X/week   Barriers to D/C:            Co-evaluation              End of Session Nurse Communication: Mobility status  Activity Tolerance: Patient limited by fatigue Patient left: in bed;with call bell/phone within reach   Time: 1610-9604 OT Time Calculation (min): 18 min Charges:  OT General Charges $OT Visit: 1 Procedure OT Evaluation $Initial OT Evaluation Tier I: 1 Procedure G-Codes:    Jeani Hawking M 2014/12/28, 3:45 PM

## 2014-12-27 LAB — BASIC METABOLIC PANEL
Anion gap: 6 (ref 5–15)
BUN: 11 mg/dL (ref 6–20)
CO2: 12 mmol/L — ABNORMAL LOW (ref 22–32)
Calcium: 8.1 mg/dL — ABNORMAL LOW (ref 8.9–10.3)
Chloride: 115 mmol/L — ABNORMAL HIGH (ref 101–111)
Creatinine, Ser: 0.82 mg/dL (ref 0.61–1.24)
GFR calc Af Amer: >60 mL/min (ref >60)
GFR calc non Af Amer: >60 mL/min (ref >60)
Glucose, Bld: 73 mg/dL (ref 65–99)
Potassium: 3.6 mmol/L (ref 3.5–5.1)
Sodium: 133 mmol/L — ABNORMAL LOW (ref 135–145)

## 2014-12-27 LAB — MAGNESIUM: MAGNESIUM: 1.4 mg/dL — AB (ref 1.7–2.4)

## 2014-12-27 LAB — PROCALCITONIN: Procalcitonin: <0.1 ng/mL

## 2014-12-27 LAB — CORTISOL-AM, BLOOD: Cortisol - AM: 25.4 ug/dL — ABNORMAL HIGH (ref 6.7–22.6)

## 2014-12-27 LAB — LACTIC ACID, PLASMA
Lactic Acid, Venous: 0.7 mmol/L (ref 0.5–2.0)
Lactic Acid, Venous: 0.7 mmol/L (ref 0.5–2.0)

## 2014-12-27 MED ORDER — LOPERAMIDE HCL 2 MG PO CAPS
2.0000 mg | ORAL_CAPSULE | ORAL | Status: DC | PRN
  Administered 2014-12-28: 2 mg via ORAL
  Filled 2014-12-27: qty 1

## 2014-12-27 MED ORDER — SODIUM CHLORIDE 0.9 % IV BOLUS (SEPSIS)
500.0000 mL | Freq: Once | INTRAVENOUS | Status: AC
  Administered 2014-12-27: 500 mL via INTRAVENOUS

## 2014-12-27 MED ORDER — ALBUMIN HUMAN 5 % IV SOLN
25.0000 g | Freq: Once | INTRAVENOUS | Status: AC
  Administered 2014-12-27: 25 g via INTRAVENOUS
  Filled 2014-12-27: qty 500

## 2014-12-27 MED ORDER — MAGNESIUM SULFATE 4 GM/100ML IV SOLN
4.0000 g | Freq: Once | INTRAVENOUS | Status: AC
  Administered 2014-12-27: 4 g via INTRAVENOUS
  Filled 2014-12-27: qty 100

## 2014-12-27 NOTE — Progress Notes (Signed)
PROGRESS NOTE  MAE CIANCI ZOX:[id number] DOB: 04-26-1985 DOA: 12/22/2014 PCP: No PCP Per Patient   Brief History 29 year old male with a history of HIV/AIDS diagnosed in 2010 who has never been on any anti-retroviral medications was recently discharged from [facility] on 12/12/2014 after treatment for abdominal pain and acute kidney injury, diarrhea. During that admission, CT of the abdomen and pelvis was negative for any acute findings. He was sent home with Bactrim DS daily and fluconazole. During that admission, stool studies were positive for Cryptosporidium and norovirus. His stool cultures were negative. After discharge, the patient continued to have diarrhea. He tried Imodium which offered some help, but he continued to have watery stools without hematochezia. The patient denies any fevers, chills, chest pain, shortness of breath, coughing, hemoptysis, headaches, rashes. He has had 4 bowel movements daily. He has had nausea and vomiting daily since discharge without any hematemesis or coffee grounds. The patient has also been complaining of solid food dysphagia for 1-2 weeks. He denies any travels or raw or undercooked foods. He works at [employer]. He went to see infectious disease, Dr. Daiva Eves, who requested admission for FTT.  Assessment/Plan: Hypotension -Most likely secondary to volume depletion secondary to diarrhea and insensible fluid loss -Usually responds to fluid boluses and increasing fluids -Check lactic acid--if within normal limits, plan to increase fluids -Give another 500 mL bolus normal saline -albumin 500cc -check cortisol -Echo Dehydration/failure to thrive -Secondary to diarrhea which is secondary to Cryptosporidium and norovirus -Check C. difficile toxin--neg -AFB blood cultures--done -Continue IV fluids -Check LFTs and lipase--negative -TSH--2.564 -UA--neg for pyuria -UDS--neg  -PT eval-->HHPT Cryptosporidium enteritis -ultimately  needs immune reconstitution -unclear if will benefit from nitazoxanide or paromomycin--defer to ID -educated pt that he has to ask for imodium AKI -improved with IVF -continue IVF  Hyponatremia -secondary to volume depletion and diarrhea -continue IVF -slowly improving  Nongapped metabolic acidosis -secondary to diarrhea  Dysphagia -no thrush on exam -clears--tolerating-->advance to soft diet-->tolerating but having intermitten dysphagia -empiric fluconazole per ID -he had been taking once weekly fluconazole Hypokalemia/hypomagnesemia -replete AIDS --12/08/2014--CD4 <10 --12/08/2014 HIV RNA--75420 -continue antiretrovirals--awaiting ADAP -continue dapsone and azithromycin for prophylaxis -G6PD level--WNL Secondary late latent syphilis -The patient received 2.4 million units benzathine penicillin at [facility] -will need to restart and give 2.4MU IM q week x 3--next dose 9/8 Generalized weakness/Deconditioning -PT eval-->HHPT -OT eval-->no OT followup Severe protein calorie malnutrition --Continue nutritional supplementation -continue marinol Right leg pain -duplex r/o DVT--neg   Dispo--Home with HHPT pending clearance by ID       Procedures/Studies: Ct Abdomen Pelvis Wo Contrast  12/08/2014   CLINICAL DATA:  Left lower quadrant and bilateral flank pain. Nausea. History of ulcerative colitis.  EXAM: CT ABDOMEN AND PELVIS WITHOUT CONTRAST  TECHNIQUE: Multidetector CT imaging of the abdomen and pelvis was performed following the standard protocol without IV contrast.  COMPARISON:  None.  FINDINGS: Linear scarring in the right lung base. Left base clear. Heart is normal size. No effusions.  Paucity of intraperitoneal fat and lack of intravenous contrast somewhat limits study. Liver, gallbladder, spleen, pancreas, adrenals and kidneys have an unremarkable unenhanced appearance. No hydronephrosis.  No evidence of bowel obstruction. Stomach, large and small bowel grossly  unremarkable. No visible areas of bowel wall thickening. No visible free fluid, free air or adenopathy. Urinary bladder is unremarkable. No acute bony abnormality.  IMPRESSION: Study somewhat limited by lack of intraperitoneal fat and  IV contrast. No visible acute findings on this nonenhanced CT.   Electronically Signed   By: Charlett Nose M.D.   On: 12/08/2014 12:35   Dg Chest 2 View  12/08/2014   CLINICAL DATA:  Increasingly worse cough today  EXAM: CHEST  2 VIEW  COMPARISON:  Multiple prior studies including 12/02/2014  FINDINGS: The heart size and mediastinal contours are within normal limits. Both lungs are clear. The visualized skeletal structures are unremarkable.  IMPRESSION: No active cardiopulmonary disease.   Electronically Signed   By: Esperanza Heir M.D.   On: 12/08/2014 15:48   Dg Abd Acute W/chest  12/02/2014   CLINICAL DATA:  29 year old male with diarrhea, nausea with increase p.o. intake and mid abdominal pain cramping for the past 7 days. History of ulcerative colitis.  EXAM: DG ABDOMEN ACUTE W/ 1V CHEST  COMPARISON:  Chest x-ray 01/09/2014.  FINDINGS: Mild scarring in the periphery of the right lung base, unchanged compared to the prior examination. Lung volumes are normal. No consolidative airspace disease. No pleural effusions. No pneumothorax. No pulmonary nodule or mass noted. Pulmonary vasculature and the cardiomediastinal silhouette are within normal limits.  Air-fluid levels are present in the colon. No pathologic dilatation of small bowel or colon. No pneumoperitoneum. Calcifications projecting over the upper abdomen are favored to be pancreatic. High density material in the right lower quadrant maybe postoperative, particularly if there is prior history of partial bowel resection.  IMPRESSION: 1. Nonspecific bowel gas pattern, as above. While findings do not indicate bowel obstruction, the presence of multiple air-fluid levels in the colon is compatible with the reported history of  diarrhea, and could indicate active colitis. 2. No pneumoperitoneum. 3. No radiographic evidence of acute cardiopulmonary disease.   Electronically Signed   By: Trudie Reed M.D.   On: 12/02/2014 18:42        Subjective: Patient continues to have approximate 4 loose stools daily. He denies any fevers, chills, chest discomfort, stress breath, nausea, vomiting, abdominal pain. He continues to have loose stools without any hematochezia or melena. No dysuria. Denies any rashes or headache. No dizziness.  Objective: Filed Vitals:   12/27/14 1100 12/27/14 1126 12/27/14 1128 12/27/14 1251  BP: 74/41 78/41 80/42  86/56  Pulse: 85 84 84 78  Temp:    97.3 F (36.3 C)  TempSrc:    Oral  Resp:    18  Height:      Weight:      SpO2:    100%    Intake/Output Summary (Last 24 hours) at 12/27/14 1316 Last data filed at 12/27/14 1038  Gross per 24 hour  Intake 4566.66 ml  Output    676 ml  Net 3890.66 ml   Weight change: 2.722 kg (6 lb) Exam:   General:  Pt is alert, follows commands appropriately, not in acute distress  HEENT: No icterus, No thrush, No neck mass, [place]/AT  Cardiovascular: RRR, S1/S2, no rubs, no gallops  Respiratory: CTA bilaterally, no wheezing, no crackles, no rhonchi  Abdomen: Soft/+BS, non tender, non distended, no guarding; no hepatosplenomegaly;  Extremities: No edema, No lymphangitis, No petechiae, No rashes, no synovitis; no cyanosis or clubbing  Data Reviewed: Basic Metabolic Panel:  Recent Labs Lab 12/22/14 2102 12/23/14 1714 12/24/14 0459 12/25/14 0430 12/26/14 0325 12/27/14 0519  NA 118* 120* 120* 124* 131* 133*  K 3.6 4.9 5.7* 4.9 2.8* 3.6  CL 87* 96* 95* 101 109 115*  CO2 16* 13* 16* 15* 16* 12*  GLUCOSE 104*  102* 92 83 88 73  BUN 77* 45* 29* 16 11 11   CREATININE 1.48* 1.06 1.04 0.83 0.86 0.82  CALCIUM 8.9 8.3* 8.0* 7.8* 7.6* 8.1*  MG 2.2  --  2.2  --   --  1.4*   Liver Function Tests:  Recent Labs Lab 12/22/14 2102 12/26/14 0325   AST 66* 60*  ALT 29 25  ALKPHOS 77 57  BILITOT 1.0 0.6  PROT 7.6 5.1*  ALBUMIN 3.6 2.3*    Recent Labs Lab 12/22/14 2102  LIPASE 33   No results for input(s): AMMONIA in the last 168 hours. CBC:  Recent Labs Lab 12/22/14 2102 12/26/14 0325  WBC 4.8 2.1*  NEUTROABS 3.6 1.1*  HGB 14.1 10.2*  HCT 37.2* 28.8*  MCV 79.8 82.1  PLT 170 155   Cardiac Enzymes: No results for input(s): CKTOTAL, CKMB, CKMBINDEX, TROPONINI in the last 168 hours. BNP: Invalid input(s): POCBNP CBG: No results for input(s): GLUCAP in the last 168 hours.  Recent Results (from the past 240 hour(s))  C difficile quick scan w PCR reflex     Status: None   Collection Time: 12/22/14 11:24 PM  Result Value Ref Range Status   C Diff antigen NEGATIVE NEGATIVE Final   C Diff toxin NEGATIVE NEGATIVE Final   C Diff interpretation Negative for toxigenic C. difficile  Final     Scheduled Meds: . [START ON 12/29/2014] azithromycin  1,200 mg Oral Q Wed  . dapsone  100 mg Oral Daily  . dolutegravir  50 mg Oral Daily  . dronabinol  5 mg Oral BID AC  . emtricitabine-tenofovir AF  1 tablet Oral Daily  . enoxaparin (LOVENOX) injection  40 mg Subcutaneous Q24H  . feeding supplement  1 Container Oral TID BM  . feeding supplement (ENSURE ENLIVE)  237 mL Oral BID BM  . fluconazole  200 mg Oral Daily  . magnesium sulfate 1 - 4 g bolus IVPB  4 g Intravenous Once   Continuous Infusions: . sodium chloride 100 mL/hr at 12/27/14 1113     Cassadee Vanzandt, DO  [facility] Pager [phone]  If 7PM-7AM, please contact night-coverage [email or web address] Password [email or web address] 12/27/2014, 1:16 PM   LOS: 5 days

## 2014-12-27 NOTE — Progress Notes (Signed)
MD Tat notified concerning morning b/p. Awaiting any further orders

## 2014-12-27 NOTE — Progress Notes (Signed)
PT Cancellation Note  Patient Details Name: MYLAN SCHWARZ MRN: [id number] DOB: 1985/09/24   Cancelled Treatment:    Reason Eval/Treat Not Completed: Medical issues which prohibited therapy. Patient with low BP at this time. Spoke with nursing who said to hold therapy until they can correct it. Will follow up as appropriate.    Fredrich Birks 12/27/2014, 12:39 PM

## 2014-12-28 ENCOUNTER — Inpatient Hospital Stay (HOSPITAL_COMMUNITY): Payer: Medicaid Other

## 2014-12-28 DIAGNOSIS — R1310 Dysphagia, unspecified: Secondary | ICD-10-CM

## 2014-12-28 DIAGNOSIS — I951 Orthostatic hypotension: Secondary | ICD-10-CM

## 2014-12-28 LAB — BASIC METABOLIC PANEL
ANION GAP: 7 (ref 5–15)
BUN: 15 mg/dL (ref 6–20)
CHLORIDE: 115 mmol/L — AB (ref 101–111)
CO2: 12 mmol/L — ABNORMAL LOW (ref 22–32)
Calcium: 8 mg/dL — ABNORMAL LOW (ref 8.9–10.3)
Creatinine, Ser: 0.88 mg/dL (ref 0.61–1.24)
Glucose, Bld: 82 mg/dL (ref 65–99)
POTASSIUM: 3 mmol/L — AB (ref 3.5–5.1)
SODIUM: 134 mmol/L — AB (ref 135–145)

## 2014-12-28 MED ORDER — SODIUM BICARBONATE 650 MG PO TABS
650.0000 mg | ORAL_TABLET | Freq: Two times a day (BID) | ORAL | Status: DC
  Administered 2014-12-28 – 2014-12-30 (×5): 650 mg via ORAL
  Filled 2014-12-28 (×6): qty 1

## 2014-12-28 MED ORDER — POTASSIUM CHLORIDE CRYS ER 20 MEQ PO TBCR
40.0000 meq | EXTENDED_RELEASE_TABLET | ORAL | Status: AC
  Administered 2014-12-28 (×2): 40 meq via ORAL
  Filled 2014-12-28 (×2): qty 2

## 2014-12-28 MED ORDER — SODIUM CHLORIDE 0.9 % IV SOLN
INTRAVENOUS | Status: DC
Start: 2014-12-28 — End: 2014-12-29
  Administered 2014-12-28: 20:00:00 via INTRAVENOUS
  Filled 2014-12-28 (×3): qty 1000

## 2014-12-28 MED ORDER — SODIUM CHLORIDE 0.9 % IV BOLUS (SEPSIS)
500.0000 mL | Freq: Once | INTRAVENOUS | Status: AC
  Administered 2014-12-28: 500 mL via INTRAVENOUS

## 2014-12-28 MED ORDER — ALBUMIN HUMAN 5 % IV SOLN
25.0000 g | Freq: Once | INTRAVENOUS | Status: AC
  Administered 2014-12-28: 25 g via INTRAVENOUS
  Filled 2014-12-28: qty 500

## 2014-12-28 NOTE — Progress Notes (Signed)
  Echocardiogram 2D Echocardiogram has been performed.  Brian Adkins 12/28/2014, 3:38 PM

## 2014-12-28 NOTE — Discharge Summary (Addendum)
PROGRESS NOTE  Brian Adkins ZOX:[id number] DOB: 1986/01/27 DOA: 12/22/2014 PCP: No PCP Per Patient   Brief History 29 year old male with a history of HIV/AIDS diagnosed in 2010 who has never been on any anti-retroviral medications was recently discharged from [facility] on 12/12/2014 after treatment for abdominal pain and acute kidney injury, diarrhea. During that admission, CT of the abdomen and pelvis was negative for any acute findings. He was sent home with Bactrim DS daily and fluconazole. During that admission, stool studies were positive for Cryptosporidium and norovirus. His stool cultures were negative. After discharge, the patient continued to have diarrhea. He tried Imodium which offered some help, but he continued to have watery stools without hematochezia. The patient denies any fevers, chills, chest pain, shortness of breath, coughing, hemoptysis, headaches, rashes. He has had 4 bowel movements daily. He has had nausea and vomiting daily since discharge without any hematemesis or coffee grounds. The patient has also been complaining of solid food dysphagia for 1-2 weeks. He denies any travels or raw or undercooked foods. He works at [employer]. He went to see infectious disease, Dr. Daiva Eves, who requested admission for FTT.  Since admission, the patient continues to loose stools although there is more substance. He is having approximately 4 stools per day without any hematochezia or melena. Imodium was started once C. difficile was negative.  Assessment/Plan: Hypotension -Most likely secondary to volume depletion secondary to diarrhea and insensible fluid loss -Usually responds to fluid boluses and increasing fluids -Check lactic acid--if within normal limits, plan to increase fluids-->0.7 -Increase fluids to 125cc/hr -9/5--albumin 500cc-->repeat 9/6 -check cortisol--25.4 -Echo--EF 50-55%, No WMA -I have educated the patient to ask for imodium -blood cultures  neg to date -orthostatic positive by pulse criteria on 9/7 Dehydration/failure to thrive -Secondary to diarrhea which is secondary to Cryptosporidium and norovirus -Check C. 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 -unclear if will benefit from nitazoxanide or paromomycin--defer to ID -educated pt that he has to ask for imodium AKI -improved with IVF -continue IVF  Hyponatremia -secondary to volume depletion and diarrhea -continue IVF -slowly improving  Nongapped metabolic acidosis -secondary to diarrhea -start bicarbonate tabs -imodium  Dysphagia -no thrush on exam -clears--tolerating-->advance to soft diet-->tolerating without dysphagia -empiric fluconazole per ID -he had been taking once weekly fluconazole Hypokalemia/hypomagnesemia -replete AIDS --12/08/2014--CD4 <10 --12/08/2014 HIV RNA--75420 -continue antiretrovirals--awaiting ADAP -continue dapsone and azithromycin for prophylaxis -G6PD level--WNL Secondary late latent syphilis -The patient received 2.4 million units benzathine penicillin at [facility] -will need to restart and give 2.4MU IM q week x 3--next dose 9/8 Generalized weakness/Deconditioning -PT eval-->HHPT -OT eval-->no OT followup Severe protein calorie malnutrition --Continue nutritional supplementation -continue marinol Right leg pain -duplex r/o DVT--neg   Dispo--Home with HHPT pending clearance by ID and BP improvement       Procedures/Studies: Ct Abdomen Pelvis Wo Contrast  12/08/2014   CLINICAL DATA:  Left lower quadrant and bilateral flank pain. Nausea. History of ulcerative colitis.  EXAM: CT ABDOMEN AND PELVIS WITHOUT CONTRAST  TECHNIQUE: Multidetector CT imaging of the abdomen and pelvis was performed following the standard protocol without IV  contrast.  COMPARISON:  None.  FINDINGS: Linear scarring in the right lung base. Left base clear. Heart is normal size. No effusions.  Paucity of intraperitoneal fat and lack of intravenous contrast somewhat limits  study. Liver, gallbladder, spleen, pancreas, adrenals and kidneys have an unremarkable unenhanced appearance. No hydronephrosis.  No evidence of bowel obstruction. Stomach, large and small bowel grossly unremarkable. No visible areas of bowel wall thickening. No visible free fluid, free air or adenopathy. Urinary bladder is unremarkable. No acute bony abnormality.  IMPRESSION: Study somewhat limited by lack of intraperitoneal fat and IV contrast. No visible acute findings on this nonenhanced CT.   Electronically Signed   By: Charlett Nose M.D.   On: 12/08/2014 12:35   Dg Chest 2 View  12/08/2014   CLINICAL DATA:  Increasingly worse cough today  EXAM: CHEST  2 VIEW  COMPARISON:  Multiple prior studies including 12/02/2014  FINDINGS: The heart size and mediastinal contours are within normal limits. Both lungs are clear. The visualized skeletal structures are unremarkable.  IMPRESSION: No active cardiopulmonary disease.   Electronically Signed   By: Esperanza Heir M.D.   On: 12/08/2014 15:48   Dg Abd Acute W/chest  12/02/2014   CLINICAL DATA:  29 year old male with diarrhea, nausea with increase p.o. intake and mid abdominal pain cramping for the past 7 days. History of ulcerative colitis.  EXAM: DG ABDOMEN ACUTE W/ 1V CHEST  COMPARISON:  Chest x-ray 01/09/2014.  FINDINGS: Mild scarring in the periphery of the right lung base, unchanged compared to the prior examination. Lung volumes are normal. No consolidative airspace disease. No pleural effusions. No pneumothorax. No pulmonary nodule or mass noted. Pulmonary vasculature and the cardiomediastinal silhouette are within normal limits.  Air-fluid levels are present in the colon. No pathologic dilatation of small bowel or colon. No pneumoperitoneum.  Calcifications projecting over the upper abdomen are favored to be pancreatic. High density material in the right lower quadrant maybe postoperative, particularly if there is prior history of partial bowel resection.  IMPRESSION: 1. Nonspecific bowel gas pattern, as above. While findings do not indicate bowel obstruction, the presence of multiple air-fluid levels in the colon is compatible with the reported history of diarrhea, and could indicate active colitis. 2. No pneumoperitoneum. 3. No radiographic evidence of acute cardiopulmonary disease.   Electronically Signed   By: Trudie Reed M.D.   On: 12/02/2014 18:42        Subjective: Still having 3-4 loose stools per day. Denies any fevers, chills, chest discomfort, shortness of breath, nausea, vomiting, diarrhea, or pain, dysphagia, headache, dizziness. No rashes. No synovitis.  Objective: Filed Vitals:   12/28/14 0739 12/28/14 0854 12/28/14 1300 12/28/14 1631  BP: 90/49 87/58 91/56  88/52  Pulse: 79 75  62  Temp:  97.6 F (36.4 C)  97.6 F (36.4 C)  TempSrc:  Oral  Oral  Resp:  18  16  Height:      Weight:      SpO2:  100% 100% 100%    Intake/Output Summary (Last 24 hours) at 12/28/14 1833 Last data filed at 12/28/14 1632  Gross per 24 hour  Intake 3391.67 ml  Output    600 ml  Net 2791.67 ml   Weight change: 0.454 kg (1 lb) Exam:   General:  Pt is alert, follows commands appropriately, not in acute distress  HEENT: No icterus, No thrush, No neck mass, [place]/AT  Cardiovascular: RRR, S1/S2, no rubs, no gallops  Respiratory: CTA bilaterally, no wheezing, no crackles, no rhonchi  Abdomen: Soft/+BS, non tender, non distended, no guarding; no hepatosplenomegaly   Extremities: No edema, No lymphangitis, No petechiae, No rashes, no synovitis; no cyanosis or clubbing  Data Reviewed: Basic Metabolic  Panel:  Recent Labs Lab 12/22/14 2102  12/24/14 0459 12/25/14 0430 12/26/14 0325 12/27/14 0519 12/28/14 0541  NA  118*  < > 120* 124* 131* 133* 134*  K 3.6  < > 5.7* 4.9 2.8* 3.6 3.0*  CL 87*  < > 95* 101 109 115* 115*  CO2 16*  < > 16* 15* 16* 12* 12*  GLUCOSE 104*  < > 92 83 88 73 82  BUN 77*  < > 29* 16 11 11 15   CREATININE 1.48*  < > 1.04 0.83 0.86 0.82 0.88  CALCIUM 8.9  < > 8.0* 7.8* 7.6* 8.1* 8.0*  MG 2.2  --  2.2  --   --  1.4*  --   < > = values in this interval not displayed. Liver Function Tests:  Recent Labs Lab 12/22/14 2102 12/26/14 0325  AST 66* 60*  ALT 29 25  ALKPHOS 77 57  BILITOT 1.0 0.6  PROT 7.6 5.1*  ALBUMIN 3.6 2.3*    Recent Labs Lab 12/22/14 2102  LIPASE 33   No results for input(s): AMMONIA in the last 168 hours. CBC:  Recent Labs Lab 12/22/14 2102 12/26/14 0325  WBC 4.8 2.1*  NEUTROABS 3.6 1.1*  HGB 14.1 10.2*  HCT 37.2* 28.8*  MCV 79.8 82.1  PLT 170 155   Cardiac Enzymes: No results for input(s): CKTOTAL, CKMB, CKMBINDEX, TROPONINI in the last 168 hours. BNP: Invalid input(s): POCBNP CBG: No results for input(s): GLUCAP in the last 168 hours.  Recent Results (from the past 240 hour(s))  C difficile quick scan w PCR reflex     Status: None   Collection Time: 12/22/14 11:24 PM  Result Value Ref Range Status   C Diff antigen NEGATIVE NEGATIVE Final   C Diff toxin NEGATIVE NEGATIVE Final   C Diff interpretation Negative for toxigenic C. difficile  Final  Culture, blood (routine x 2)     Status: None (Preliminary result)   Collection Time: 12/27/14 12:15 PM  Result Value Ref Range Status   Specimen Description BLOOD LEFT ANTECUBITAL  Final   Special Requests BOTTLES DRAWN AEROBIC ONLY 2CC  Final   Culture NO GROWTH 1 DAY  Final   Report Status PENDING  Incomplete  Culture, blood (routine x 2)     Status: None (Preliminary result)   Collection Time: 12/27/14 12:36 PM  Result Value Ref Range Status   Specimen Description BLOOD LEFT HAND  Final   Special Requests BOTTLES DRAWN AEROBIC ONLY 2CC  Final   Culture NO GROWTH 1 DAY  Final    Report Status PENDING  Incomplete     Scheduled Meds: . albumin human  25 g Intravenous Once  . [START ON 12/29/2014] azithromycin  1,200 mg Oral Q Wed  . dapsone  100 mg Oral Daily  . dolutegravir  50 mg Oral Daily  . dronabinol  5 mg Oral BID AC  . emtricitabine-tenofovir AF  1 tablet Oral Daily  . enoxaparin (LOVENOX) injection  40 mg Subcutaneous Q24H  . feeding supplement  1 Container Oral TID BM  . feeding supplement (ENSURE ENLIVE)  237 mL Oral BID BM  . fluconazole  200 mg Oral Daily  . sodium bicarbonate  650 mg Oral BID   Continuous Infusions: . sodium chloride 0.9 % 1,000 mL with potassium chloride 20 mEq infusion       Clariece Roesler, DO  [facility] Pager [phone]  If 7PM-7AM, please contact night-coverage [email or web address] Password [facility] 12/28/2014, 6:33 PM  LOS: 6 days   

## 2014-12-28 NOTE — Progress Notes (Signed)
INFECTIOUS DISEASE PROGRESS NOTE  ID: Brian Adkins is a 29 y.o. male with  Active Problems:   Hyponatremia   Diarrhea   Human immunodeficiency virus (HIV) disease   AIDS   Gastroenteritis due to Cryptosporidium   Failure to thrive in adult   Dysphagia   Dehydration   Protein-calorie malnutrition, severe   Late syphilis  Subjective: 4 loose BM today.   Abtx:  Anti-infectives    Start     Dose/Rate Route Frequency Ordered Stop   12/29/14 1000  azithromycin (ZITHROMAX) tablet 1,200 mg     1,200 mg Oral Every Wed 12/23/14 0819     12/27/14 0900  sulfamethoxazole-trimethoprim (BACTRIM DS,SEPTRA DS) 800-160 MG per tablet 1 tablet  Status:  Discontinued     1 tablet Oral Once per day on Mon Wed Fri 12/23/14 1610 12/25/14 1315   12/25/14 1400  dapsone tablet 100 mg     100 mg Oral Daily 12/25/14 1315     12/25/14 1400  fluconazole (DIFLUCAN) tablet 200 mg     200 mg Oral Daily 12/25/14 1315     12/24/14 1000  emtricitabine-tenofovir AF (DESCOVY) 200-25 MG per tablet 1 tablet     1 tablet Oral Daily 12/23/14 1746     12/23/14 1000  sulfamethoxazole-trimethoprim (BACTRIM DS,SEPTRA DS) 800-160 MG per tablet 1 tablet  Status:  Discontinued     1 tablet Oral Daily 12/22/14 1753 12/23/14 1610   12/22/14 2245  azithromycin (ZITHROMAX) tablet 1,200 mg  Status:  Discontinued     1,200 mg Oral Every 7 days 12/22/14 1753 12/23/14 0819   12/22/14 2145  dolutegravir (TIVICAY) tablet 50 mg     50 mg Oral Daily 12/22/14 2131     12/22/14 2131  emtricitabine-tenofovir (TRUVADA) 200-300 MG per tablet 1 tablet  Status:  Discontinued     1 tablet Oral Daily 12/22/14 2131 12/23/14 1746      Medications:  Scheduled: . [START ON 12/29/2014] azithromycin  1,200 mg Oral Q Wed  . dapsone  100 mg Oral Daily  . dolutegravir  50 mg Oral Daily  . dronabinol  5 mg Oral BID AC  . emtricitabine-tenofovir AF  1 tablet Oral Daily  . enoxaparin (LOVENOX) injection  40 mg Subcutaneous Q24H  . feeding  supplement  1 Container Oral TID BM  . feeding supplement (ENSURE ENLIVE)  237 mL Oral BID BM  . fluconazole  200 mg Oral Daily    Objective: Vital signs in last 24 hours: Temp:  [97.6 F (36.4 C)-98.2 F (36.8 C)] 97.6 F (36.4 C) (09/06 0854) Pulse Rate:  [75-85] 75 (09/06 0854) Resp:  [17-18] 18 (09/06 0854) BP: (84-92)/(49-58) 91/56 mmHg (09/06 1300) SpO2:  [91 %-100 %] 100 % (09/06 1300) Weight:  [46.72 kg (103 lb)] 46.72 kg (103 lb) (09/05 2043)   General appearance: alert, cooperative, cachectic and no distress Resp: clear to auscultation bilaterally Cardio: regular rate and rhythm GI: normal findings: bowel sounds normal and soft, non-tender  Lab Results  Recent Labs  12/26/14 0325 12/27/14 0519 12/28/14 0541  WBC 2.1*  --   --   HGB 10.2*  --   --   HCT 28.8*  --   --   NA 131* 133* 134*  K 2.8* 3.6 3.0*  CL 109 115* 115*  CO2 16* 12* 12*  BUN CREATININE 0.86 0.82 0.88   Liver Panel  Recent Labs  12/26/14 0325  PROT 5.1*  ALBUMIN 2.3*  AST 60*  ALT 25  ALKPHOS 57  BILITOT 0.6   Sedimentation Rate No results for input(s): ESRSEDRATE in the last 72 hours. C-Reactive Protein No results for input(s): CRP in the last 72 hours.  Microbiology: Recent Results (from the past 240 hour(s))  C difficile quick scan w PCR reflex     Status: None   Collection Time: 12/22/14 11:24 PM  Result Value Ref Range Status   C Diff antigen NEGATIVE NEGATIVE Final   C Diff toxin NEGATIVE NEGATIVE Final   C Diff interpretation Negative for toxigenic C. difficile  Final  Culture, blood (routine x 2)     Status: None (Preliminary result)   Collection Time: 12/27/14 12:15 PM  Result Value Ref Range Status   Specimen Description BLOOD LEFT ANTECUBITAL  Final   Special Requests BOTTLES DRAWN AEROBIC ONLY 2CC  Final   Culture NO GROWTH 1 DAY  Final   Report Status PENDING  Incomplete  Culture, blood (routine x 2)     Status: None (Preliminary result)    Collection Time: 12/27/14 12:36 PM  Result Value Ref Range Status   Specimen Description BLOOD LEFT HAND  Final   Special Requests BOTTLES DRAWN AEROBIC ONLY 2CC  Final   Culture NO GROWTH 1 DAY  Final   Report Status PENDING  Incomplete    Studies/Results: No results found.   Assessment/Plan: HIV/AIDS  -Continue descovy, tivicay  Dysphagia  - watch for response on azole, consider EGD if not improved.  Cryptosporidia diarrhea  -watch for response on ART.   Syphilis  - repeat PEN injection on 9-8  Failure to thrive  - nutrition support.   - hypotension continues.   - eating well by his report.    Would he benefit from SNF?         [name] Infectious Diseases (pager) [phone] [email or web address] 12/28/2014, 4:17 PM  LOS: 6 days

## 2014-12-28 NOTE — Progress Notes (Signed)
BP this AM is 84/54.  Asymptomatic.  Currently does not have IV access due to malposition, but IV team consult placed. Waiting on new IV placement.  Notified Merdis Delay, NP.  New orders received for 500 cc bolus of NS when new IV in place.  Will continue to monitor.

## 2014-12-28 NOTE — Progress Notes (Signed)
Occupational Therapy Treatment Patient Details Name: Brian Adkins MRN: [id number] DOB: 18-Aug-1985 Today's Date: 12/28/2014    History of present illness  Patient is a 29 yo male admitted 12/22/14 with continued N/V and diarrhea secondary to cryptosporidium and norovirus. Patient with dehydration and FTT. Rt calf pain tested negative for DVT. PMH includes HIV/AIDS, CAP, Syphilis, pericardial effusion, MRSA, Ulcerative colitis, empyema, anemia    OT comments  PTA pt was independent with all ADLs, currently, pt is having functional limitations with ADLs/IADLs due to weakness stated above. Per nursing note pt's BP has been consistently low. Prior to session pt's BP was 91/56 limiting therapy session. However, pt was able to perform sit<> stand transfer requiring supervision for safety and ambulated to sink to perform hand hygiene. Therapist noted no LOB and pt appeared safe during task. Per pt "moving to fast causes me to cough". After therapy session pt's BP was 91/65.  Follow Up Recommendations  No OT follow up;Supervision - Intermittent    Equipment Recommendations  Tub/shower seat    Recommendations for Other Services      Precautions / Restrictions Precautions Precautions: Fall Restrictions Weight Bearing Restrictions: No       Mobility Bed Mobility Overal bed mobility:  (Pt found in chair)                Transfers Overall transfer level: Independent Equipment used: None Transfers: Sit to/from Stand Sit to Stand: Supervision         General transfer comment: Increased time for safety due to weakness    Balance                                   ADL Overall ADL's : At baseline                                       General ADL Comments: Pt requires additional time for completion of ADLs due to weakness      Vision                     Perception     Praxis      Cognition   Behavior During Therapy: [facility] for  tasks assessed/performed;Flat affect Overall Cognitive Status: Within Functional Limits for tasks assessed                       Extremity/Trunk Assessment               Exercises     Shoulder Instructions       General Comments      Pertinent Vitals/ Pain       Pain Assessment: No/denies pain Pain Score: 0-No pain  Home Living                                          Prior Functioning/Environment              Frequency Min 2X/week     Progress Toward Goals  OT Goals(current goals can now be found in the care plan section)  Progress towards OT goals: Progressing toward goals  Acute Rehab OT Goals Patient Stated Goal: to feel better  OT Goal Formulation:  With patient Time For Goal Achievement: 01/09/15 Potential to Achieve Goals: Good ADL Goals Pt Will Perform Grooming: with modified independence;standing Pt Will Perform Upper Body Bathing: with modified independence;sitting;standing Pt Will Perform Lower Body Bathing: with modified independence;sit to/from stand Pt Will Perform Upper Body Dressing: with modified independence;sitting Pt Will Perform Lower Body Dressing: with modified independence;sit to/from stand Pt Will Transfer to Toilet: with modified independence;ambulating;regular height toilet;bedside commode;grab bars Pt Will Perform Toileting - Clothing Manipulation and hygiene: with modified independence;sit to/from stand Pt Will Perform Tub/Shower Transfer: Tub transfer;with modified independence;ambulating;shower seat  Plan Discharge plan remains appropriate    Co-evaluation                 End of Session Equipment Utilized During Treatment: Gait belt   Activity Tolerance Other (comment) (Pt limited due to vitals (low BP))   Patient Left in chair;with call bell/phone within reach   Nurse Communication Other (comment)        Time: 1610-9604 OT Time Calculation (min): 20 min  Charges:    Smiley Houseman 12/28/2014, 1:51 PM

## 2014-12-28 NOTE — Evaluation (Addendum)
Physical Therapy Treatment Patient Details Name: Brian Adkins MRN: [id number] DOB: 1985/09/27 Today's Date: 12/28/2014   History of Present Illness   Patient is a 29 yo male admitted 12/22/14 with continued N/V and diarrhea secondary to cryptosporidium and norovirus. Patient with dehydration and FTT. Rt calf pain tested negative for DVT. PMH includes HIV/AIDS, CAP, Syphilis, pericardial effusion, MRSA, Ulcerative colitis, empyema, anemia  (Simultaneous filing. User may not have seen previous data.)  Clinical Impression   Pt admitted with above diagnosis. Pt currently with functional limitations due to the deficits listed below (see PT Problem List).  Pt will benefit from skilled PT to increase their independence and safety with mobility to allow discharge to the venue listed below.       Follow Up Recommendations Home health PT;Supervision - Intermittent    Equipment Recommendations  None recommended by PT    Recommendations for Other Services       Precautions / Restrictions Precautions Precautions: Fall (Simultaneous filing. User may not have seen previous data.) Precaution Comments: fall risk is slight Restrictions Weight Bearing Restrictions: No      Mobility  Bed Mobility Overal bed mobility: Modified Independent (Simultaneous filing. User may not have seen previous data.)                Transfers Overall transfer level: Needs assistance (Simultaneous filing. User may not have seen previous data.) Equipment used: None (Simultaneous filing. User may not have seen previous data.) Transfers: Sit to/from Stand (Simultaneous filing. User may not have seen previous data.) Sit to Stand: Supervision (Simultaneous filing. User may not have seen previous data.)         General transfer comment: Cues to self-monitor for activiyt otlerance (Simultaneous filing. User may not have seen previous data.)  Ambulation/Gait Ambulation/Gait assistance: Supervision Ambulation  Distance (Feet): 20 Feet Assistive device:  (pushign IV pole) Gait Pattern/deviations: Step-through pattern Gait velocity: decreased   General Gait Details: Patient with very slow and unsteady gait.  Drifts to both sides, but able to self correct.   Stairs            Wheelchair Mobility    Modified Rankin (Stroke Patients Only)       Balance             Standing balance-Leahy Scale: Fair                               Pertinent Vitals/Pain Pain Assessment: No/denies pain (Simultaneous filing. User may not have seen previous data.) Pain Score: 0-No pain    Home Living Family/patient expects to be discharged to:: Private residence Living Arrangements: Parent (Mother) Available Help at Discharge: Family;Available 24 hours/day (Works, but can be at home when needed.) Type of Home: House Home Access: Stairs to enter Entrance Stairs-Rails: None Secretary/administrator of Steps: 3 Home Layout: One level Home Equipment: None      Prior Function Level of Independence: Independent         Comments: Does not drive.     Hand Dominance   Dominant Hand: Right    Extremity/Trunk Assessment   Upper Extremity Assessment: Defer to OT evaluation           Lower Extremity Assessment: Generalized weakness      Cervical / Trunk Assessment: Other exceptions  Communication   Communication: No difficulties  Cognition Arousal/Alertness: Awake/alert (Simultaneous filing. User may not have seen previous data.) Behavior During Therapy: Flat  affect (Occasional smile Simultaneous filing. User may not have seen previous data.) Overall Cognitive Status: Within Functional Limits for tasks assessed (Simultaneous filing. User may not have seen previous data.)                      General Comments General comments (skin integrity, edema, etc.):     12/28/14 1100 12/28/14 1148 12/28/14 1154  Vital Signs  Patient Position (if appropriate) Orthostatic  Vitals --  --   Orthostatic Lying   BP- Lying (!) 89/75 mmHg --  --   Pulse- Lying 75 --  --   Orthostatic Sitting  BP- Sitting 104/63 mmHg 93/66 mmHg 95/65 mmHg  Pulse- Sitting 99 91 90  Orthostatic Standing at 0 minutes  BP- Standing at 0 minutes --  92/63 mmHg --   Pulse- Standing at 0 minutes --  108 --      12/28/14 1300  Vital Signs  Patient Position (if appropriate) Sitting  Orthostatic Lying   BP- Lying --   Pulse- Lying --   Orthostatic Sitting  BP- Sitting --   Pulse- Sitting --   Orthostatic Standing at 0 minutes  BP- Standing at 0 minutes --   Pulse- Standing at 0 minutes --        Exercises        Assessment/Plan    PT Assessment Patient needs continued PT services  PT Diagnosis Generalized weakness   PT Problem List Decreased strength;Decreased activity tolerance;Decreased balance;Decreased mobility;Decreased knowledge of use of DME;Pain  PT Treatment Interventions DME instruction;Gait training;Functional mobility training;Therapeutic activities;Therapeutic exercise;Balance training;Patient/family education   PT Goals (Current goals can be found in the Care Plan section) Acute Rehab PT Goals Patient Stated Goal: to feel better  (Simultaneous filing. User may not have seen previous data.) PT Goal Formulation: With patient Time For Goal Achievement: 01/11/15 Potential to Achieve Goals: Good    Frequency Min 3X/week   Barriers to discharge        Co-evaluation               End of Session   Activity Tolerance: Patient limited by fatigue Patient left: in chair;with call bell/phone within reach Nurse Communication: Mobility status         Time: 1136-1204 PT Time Calculation (min) (ACUTE ONLY): 28 min   Charges:    PT Treatments $Gait Training: 23-37 mins   PT G Codes:        Olen Pel 12/28/2014, 1:52 PM  Van Clines, [place]  Acute Rehabilitation Services Pager [phone] Office [phone]

## 2014-12-28 NOTE — Progress Notes (Signed)
PT Cancellation Note  Patient Details Name: Brian Adkins MRN: [id number] DOB: [birth year]   Cancelled Treatment:    Reason Eval/Treat Not Completed: Medical issues which prohibited therapy   Continued hypotension; awaiting IV access and fluid bolus;   Will follow up later today as time allows;  Otherwise, will follow up for PT tomorrow;   Thank you,  Van Clines, PT  Acute Rehabilitation Services Pager [phone] Office [phone]     Van Clines [facility] 12/28/2014, 8:24 AM

## 2014-12-29 DIAGNOSIS — A529 Late syphilis, unspecified: Secondary | ICD-10-CM

## 2014-12-29 DIAGNOSIS — R197 Diarrhea, unspecified: Secondary | ICD-10-CM

## 2014-12-29 LAB — MAGNESIUM: MAGNESIUM: 1.7 mg/dL (ref 1.7–2.4)

## 2014-12-29 LAB — CBC
HEMATOCRIT: 30.1 % — AB (ref 39.0–52.0)
Hemoglobin: 11 g/dL — ABNORMAL LOW (ref 13.0–17.0)
MCH: 30.2 pg (ref 26.0–34.0)
MCHC: 36.5 g/dL — AB (ref 30.0–36.0)
MCV: 82.7 fL (ref 78.0–100.0)
PLATELETS: 211 10*3/uL (ref 150–400)
RBC: 3.64 MIL/uL — ABNORMAL LOW (ref 4.22–5.81)
RDW: 15.1 % (ref 11.5–15.5)
WBC: 1.8 10*3/uL — AB (ref 4.0–10.5)

## 2014-12-29 LAB — BASIC METABOLIC PANEL
ANION GAP: 7 (ref 5–15)
BUN: 16 mg/dL (ref 6–20)
CALCIUM: 8.4 mg/dL — AB (ref 8.9–10.3)
CO2: 12 mmol/L — ABNORMAL LOW (ref 22–32)
Chloride: 122 mmol/L — ABNORMAL HIGH (ref 101–111)
Creatinine, Ser: 0.83 mg/dL (ref 0.61–1.24)
GFR calc Af Amer: >60 mL/min (ref >60)
Glucose, Bld: 96 mg/dL (ref 65–99)
POTASSIUM: 3.1 mmol/L — AB (ref 3.5–5.1)
SODIUM: 141 mmol/L (ref 135–145)

## 2014-12-29 LAB — CORTISOL: CORTISOL PLASMA: 24.3 ug/dL

## 2014-12-29 LAB — PROCALCITONIN

## 2014-12-29 MED ORDER — MIDODRINE HCL 5 MG PO TABS
5.0000 mg | ORAL_TABLET | Freq: Three times a day (TID) | ORAL | Status: DC
  Administered 2014-12-29 – 2015-01-01 (×9): 5 mg via ORAL
  Filled 2014-12-29 (×10): qty 1

## 2014-12-29 MED ORDER — KCL-LACTATED RINGERS 20 MEQ/L IV SOLN
INTRAVENOUS | Status: AC
  Administered 2014-12-29 (×2): via INTRAVENOUS
  Filled 2014-12-29 (×2): qty 1000

## 2014-12-29 MED ORDER — POTASSIUM CHLORIDE CRYS ER 20 MEQ PO TBCR
40.0000 meq | EXTENDED_RELEASE_TABLET | ORAL | Status: AC
  Administered 2014-12-29 (×2): 40 meq via ORAL
  Filled 2014-12-29 (×2): qty 2

## 2014-12-29 MED ORDER — NITAZOXANIDE 500 MG PO TABS
500.0000 mg | ORAL_TABLET | Freq: Two times a day (BID) | ORAL | Status: DC
  Administered 2014-12-30 – 2015-01-01 (×5): 500 mg via ORAL
  Filled 2014-12-29 (×8): qty 1

## 2014-12-29 MED ORDER — LOPERAMIDE HCL 2 MG PO CAPS
2.0000 mg | ORAL_CAPSULE | Freq: Four times a day (QID) | ORAL | Status: DC | PRN
  Administered 2014-12-31 (×2): 2 mg via ORAL
  Filled 2014-12-29 (×2): qty 1

## 2014-12-29 MED ORDER — MAGNESIUM SULFATE IN D5W 10-5 MG/ML-% IV SOLN
1.0000 g | Freq: Once | INTRAVENOUS | Status: AC
  Administered 2014-12-29: 1 g via INTRAVENOUS
  Filled 2014-12-29: qty 100

## 2014-12-29 MED ORDER — PENICILLIN G BENZATHINE 1200000 UNIT/2ML IM SUSP
2.4000 10*6.[IU] | Freq: Once | INTRAMUSCULAR | Status: AC
  Administered 2014-12-29: 2.4 10*6.[IU] via INTRAMUSCULAR
  Filled 2014-12-29: qty 4

## 2014-12-29 MED ORDER — NITAZOXANIDE 500 MG PO TABS
500.0000 mg | ORAL_TABLET | Freq: Two times a day (BID) | ORAL | Status: DC
  Filled 2014-12-29 (×2): qty 1

## 2014-12-29 MED ORDER — LOPERAMIDE HCL 2 MG PO CAPS
2.0000 mg | ORAL_CAPSULE | Freq: Four times a day (QID) | ORAL | Status: AC
  Administered 2014-12-29 – 2014-12-30 (×4): 2 mg via ORAL
  Filled 2014-12-29 (×4): qty 1

## 2014-12-29 NOTE — Progress Notes (Signed)
Patient to start nitazoxanide  PO BID x14 days for cryptosporidium in HIV patients.  This is non-formulary item, and is currently on order to be delivered to [facility] on 12/30/2014.  The order has been entered to start 9/8.  Juron Vorhees D. Syanne Looney, PharmD, BCPS Clinical Pharmacist Pager: [phone] 12/29/2014 12:30 PM

## 2014-12-29 NOTE — Progress Notes (Signed)
PROGRESS NOTE  Brian Adkins ZOX:[id number] DOB: 11-27-1985 DOA: 12/22/2014 PCP: No PCP Per Patient   Brief History 29 year old male with a history of HIV/AIDS diagnosed in 2010 who has never been on any anti-retroviral medications was recently discharged from [facility] on 12/12/2014 after treatment for abdominal pain and acute kidney injury, diarrhea. During that admission, CT of the abdomen and pelvis was negative for any acute findings. He was sent home with Bactrim DS daily and fluconazole. During that admission, stool studies were positive for Cryptosporidium and norovirus. His stool cultures were negative. After discharge, the patient continued to have diarrhea. He tried Imodium which offered some help, but he continued to have watery stools without hematochezia. The patient denies any fevers, chills, chest pain, shortness of breath, coughing, hemoptysis, headaches, rashes. He has had 4 bowel movements daily. He has had nausea and vomiting daily since discharge without any hematemesis or coffee grounds. The patient has also been complaining of solid food dysphagia for 1-2 weeks. He denies any travels or raw or undercooked foods. He works at [employer]. He went to see infectious disease, Dr. Daiva Eves, who requested admission for FTT.   Subjective: Patient in bed, diarrhea somewhat improved with 3-4 bowel movements a day, denies any chest or abdominal pain. No shortness of breath. No focal weakness. No headache or fever.  Assessment/Plan:  Hypotension -Due to dehydration from ongoing diarrhea and fluid loss, replace fluid changed to lactated Ringer as chloride levels are rising, TSH a.m. cortisol stable. Admit to drain.  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 difficile. LFTs and lipase are negative, ID following. For  now will place him on nitazoxanide and monitor. Continue Imodium to reduce diarrhea.   AKI -Due to dehydration from diarrhea, improved with IV fluids continue hydration.  Hyponatremia -secondary to volume depletion and diarrhea, and a new IV fluids and monitor.    Nongapped metabolic acidosis -secondary to diarrhea    Dysphagia -no thrush on exam, clears--tolerating-->advance to soft diet-->tolerating but having intermitten dysphagia, on empiric fluconazole per ID, he had been taking once weekly fluconazole  Hypokalemia/hypomagnesemia -replete  AIDS --12/08/2014--CD4 <10 --12/08/2014 HIV RNA--75420 -continue antiretrovirals--awaiting ADAP -continue dapsone and azithromycin for prophylaxis -G6PD level--WNL   Secondary late latent syphilis -The patient received 2.4 million units benzathine penicillin at [facility] -will need to restart and give 2.4MU IM q week x 3--next dose 9/8, defer to ID.   Generalized weakness/Deconditioning -PT eval-->HHPT -OT eval-->no OT followup  Severe protein calorie malnutrition --Continue nutritional supplementation -continue marinol  Right leg pain -duplex ruled out DVT--    Dispo--Home with HHPT pending clearance by ID  DVT prophylaxis on Lovenox     Procedures/Studies: Ct Abdomen Pelvis Wo Contrast  12/08/2014   CLINICAL DATA:  Left lower quadrant and bilateral flank pain. Nausea. History of ulcerative colitis.  EXAM: CT ABDOMEN AND PELVIS WITHOUT CONTRAST  TECHNIQUE: Multidetector CT imaging of the abdomen and pelvis was performed following the standard protocol without IV contrast.  COMPARISON:  None.  FINDINGS: Linear scarring in the right lung base. Left base clear. Heart is normal size. No effusions.  Paucity of intraperitoneal fat and lack of intravenous contrast somewhat limits study. Liver, gallbladder, spleen, pancreas, adrenals and kidneys have an unremarkable unenhanced appearance. No hydronephrosis.  No evidence of bowel  obstruction. Stomach, large and small  bowel grossly unremarkable. No visible areas of bowel wall thickening. No visible free fluid, free air or adenopathy. Urinary bladder is unremarkable. No acute bony abnormality.  IMPRESSION: Study somewhat limited by lack of intraperitoneal fat and IV contrast. No visible acute findings on this nonenhanced CT.   Electronically Signed   By: Charlett Nose M.D.   On: 12/08/2014 12:35   Dg Chest 2 View  12/08/2014   CLINICAL DATA:  Increasingly worse cough today  EXAM: CHEST  2 VIEW  COMPARISON:  Multiple prior studies including 12/02/2014  FINDINGS: The heart size and mediastinal contours are within normal limits. Both lungs are clear. The visualized skeletal structures are unremarkable.  IMPRESSION: No active cardiopulmonary disease.   Electronically Signed   By: Esperanza Heir M.D.   On: 12/08/2014 15:48   Dg Abd Acute W/chest  12/02/2014   CLINICAL DATA:  29 year old male with diarrhea, nausea with increase p.o. intake and mid abdominal pain cramping for the past 7 days. History of ulcerative colitis.  EXAM: DG ABDOMEN ACUTE W/ 1V CHEST  COMPARISON:  Chest x-ray 01/09/2014.  FINDINGS: Mild scarring in the periphery of the right lung base, unchanged compared to the prior examination. Lung volumes are normal. No consolidative airspace disease. No pleural effusions. No pneumothorax. No pulmonary nodule or mass noted. Pulmonary vasculature and the cardiomediastinal silhouette are within normal limits.  Air-fluid levels are present in the colon. No pathologic dilatation of small bowel or colon. No pneumoperitoneum. Calcifications projecting over the upper abdomen are favored to be pancreatic. High density material in the right lower quadrant maybe postoperative, particularly if there is prior history of partial bowel resection.  IMPRESSION: 1. Nonspecific bowel gas pattern, as above. While findings do not indicate bowel obstruction, the presence of multiple air-fluid levels in  the colon is compatible with the reported history of diarrhea, and could indicate active colitis. 2. No pneumoperitoneum. 3. No radiographic evidence of acute cardiopulmonary disease.   Electronically Signed   By: Trudie Reed M.D.   On: 12/02/2014 18:42        Objective: Filed Vitals:   12/28/14 1631 12/28/14 2047 12/29/14 0518 12/29/14 0830  BP: 88/52 90/54 89/53  89/55  Pulse: 62 78 89 84  Temp: 97.6 F (36.4 C) 97.5 F (36.4 C) 97.6 F (36.4 C) 97.5 F (36.4 C)  TempSrc: Oral Oral Oral Oral  Resp: 16 17 16 17   Height:      Weight:  46.8 kg (103 lb 2.8 oz)    SpO2: 100% 100% 98% 99%    Intake/Output Summary (Last 24 hours) at 12/29/14 1110 Last data filed at 12/29/14 0856  Gross per 24 hour  Intake 1548.33 ml  Output    798 ml  Net 750.33 ml   Weight change: 0.079 kg (2.8 oz) Exam:   General:  Pt is alert, follows commands appropriately, not in acute distress  HEENT: No icterus, No thrush, No neck mass, [place]/AT  Cardiovascular: RRR, S1/S2, no rubs, no gallops  Respiratory: CTA bilaterally, no wheezing, no crackles, no rhonchi  Abdomen: Soft/+BS, non tender, non distended, no guarding; no hepatosplenomegaly;  Extremities: No edema, No lymphangitis, No petechiae, No rashes, no synovitis; no cyanosis or clubbing  Data Reviewed: Basic Metabolic Panel:  Recent Labs Lab 12/22/14 2102  12/24/14 0459 12/25/14 0430 12/26/14 0325 12/27/14 0519 12/28/14 0541 12/29/14 0506 12/29/14 0705  NA 118*  < > 120* 124* 131* 133* 134* 141  --   K 3.6  < > 5.7* 4.9  2.8* 3.6 3.0* 3.1*  --   CL 87*  < > 95* 101 109 115* 115* 122*  --   CO2 16*  < > 16* 15* 16* 12* 12* 12*  --   GLUCOSE 104*  < > 92 83 88 73 82 96  --   BUN 77*  < > 29* 16 11 11 15 16   --   CREATININE 1.48*  < > 1.04 0.83 0.86 0.82 0.88 0.83  --   CALCIUM 8.9  < > 8.0* 7.8* 7.6* 8.1* 8.0* 8.4*  --   MG 2.2  --  2.2  --   --  1.4*  --   --  1.7  < > = values in this interval not displayed. Liver  Function Tests:  Recent Labs Lab 12/22/14 2102 12/26/14 0325  AST 66* 60*  ALT 29 25  ALKPHOS 77 57  BILITOT 1.0 0.6  PROT 7.6 5.1*  ALBUMIN 3.6 2.3*    Recent Labs Lab 12/22/14 2102  LIPASE 33   No results for input(s): AMMONIA in the last 168 hours. CBC:  Recent Labs Lab 12/22/14 2102 12/26/14 0325 12/29/14 0506  WBC 4.8 2.1* 1.8*  NEUTROABS 3.6 1.1*  --   HGB 14.1 10.2* 11.0*  HCT 37.2* 28.8* 30.1*  MCV 79.8 82.1 82.7  PLT 170 155 211   Cardiac Enzymes: No results for input(s): CKTOTAL, CKMB, CKMBINDEX, TROPONINI in the last 168 hours. BNP: Invalid input(s): POCBNP CBG: No results for input(s): GLUCAP in the last 168 hours.  Recent Results (from the past 240 hour(s))  C difficile quick scan w PCR reflex     Status: None   Collection Time: 12/22/14 11:24 PM  Result Value Ref Range Status   C Diff antigen NEGATIVE NEGATIVE Final   C Diff toxin NEGATIVE NEGATIVE Final   C Diff interpretation Negative for toxigenic C. difficile  Final  Culture, blood (routine x 2)     Status: None (Preliminary result)   Collection Time: 12/27/14 12:15 PM  Result Value Ref Range Status   Specimen Description BLOOD LEFT ANTECUBITAL  Final   Special Requests BOTTLES DRAWN AEROBIC ONLY 2CC  Final   Culture NO GROWTH 1 DAY  Final   Report Status PENDING  Incomplete  Culture, blood (routine x 2)     Status: None (Preliminary result)   Collection Time: 12/27/14 12:36 PM  Result Value Ref Range Status   Specimen Description BLOOD LEFT HAND  Final   Special Requests BOTTLES DRAWN AEROBIC ONLY 2CC  Final   Culture NO GROWTH 1 DAY  Final   Report Status PENDING  Incomplete     Scheduled Meds: . azithromycin  1,200 mg Oral Q Wed  . dapsone  100 mg Oral Daily  . dolutegravir  50 mg Oral Daily  . dronabinol  5 mg Oral BID AC  . emtricitabine-tenofovir AF  1 tablet Oral Daily  . enoxaparin (LOVENOX) injection  40 mg Subcutaneous Q24H  . feeding supplement  1 Container Oral TID  BM  . feeding supplement (ENSURE ENLIVE)  237 mL Oral BID BM  . fluconazole  200 mg Oral Daily  . loperamide  2 mg Oral Q6H  . midodrine  5 mg Oral TID WC  . sodium bicarbonate  650 mg Oral BID   Continuous Infusions: . lactated ringers with KCl 20 mEq/L 75 mL/hr at 12/29/14 9604    Anti-infectives    Start     Dose/Rate Route Frequency Ordered Stop  12/29/14 1000  azithromycin (ZITHROMAX) tablet 1,200 mg     1,200 mg Oral Every Wed 12/23/14 0819     12/27/14 0900  sulfamethoxazole-trimethoprim (BACTRIM DS,SEPTRA DS) 800-160 MG per tablet 1 tablet  Status:  Discontinued     1 tablet Oral Once per day on Mon Wed Fri 12/23/14 1610 12/25/14 1315   12/25/14 1400  dapsone tablet 100 mg     100 mg Oral Daily 12/25/14 1315     12/25/14 1400  fluconazole (DIFLUCAN) tablet 200 mg     200 mg Oral Daily 12/25/14 1315     12/24/14 1000  emtricitabine-tenofovir AF (DESCOVY) 200-25 MG per tablet 1 tablet     1 tablet Oral Daily 12/23/14 1746     12/23/14 1000  sulfamethoxazole-trimethoprim (BACTRIM DS,SEPTRA DS) 800-160 MG per tablet 1 tablet  Status:  Discontinued     1 tablet Oral Daily 12/22/14 1753 12/23/14 1610   12/22/14 2245  azithromycin (ZITHROMAX) tablet 1,200 mg  Status:  Discontinued     1,200 mg Oral Every 7 days 12/22/14 1753 12/23/14 0819   12/22/14 2145  dolutegravir (TIVICAY) tablet 50 mg     50 mg Oral Daily 12/22/14 2131     12/22/14 2131  emtricitabine-tenofovir (TRUVADA) 200-300 MG per tablet 1 tablet  Status:  Discontinued     1 tablet Oral Daily 12/22/14 2131 12/23/14 1746        Esaul Dorwart K M.D on 12/29/2014 at 11:11 AM  Between 7am to 7pm - Pager - [phone], After 7pm go to [email or web address] - password [facility]  [facility]  - Office  [phone]   LOS: 7 days

## 2014-12-30 DIAGNOSIS — E43 Unspecified severe protein-calorie malnutrition: Secondary | ICD-10-CM

## 2014-12-30 LAB — CBC
HCT: 33 % — ABNORMAL LOW (ref 39.0–52.0)
HEMOGLOBIN: 11.6 g/dL — AB (ref 13.0–17.0)
MCH: 29 pg (ref 26.0–34.0)
MCHC: 35.2 g/dL (ref 30.0–36.0)
MCV: 82.5 fL (ref 78.0–100.0)
PLATELETS: 231 10*3/uL (ref 150–400)
RBC: 4 MIL/uL — AB (ref 4.22–5.81)
RDW: 15.3 % (ref 11.5–15.5)
WBC: 2.1 10*3/uL — ABNORMAL LOW (ref 4.0–10.5)

## 2014-12-30 LAB — BASIC METABOLIC PANEL
Anion gap: 9 (ref 5–15)
BUN: 19 mg/dL (ref 6–20)
CHLORIDE: 115 mmol/L — AB (ref 101–111)
CO2: 13 mmol/L — ABNORMAL LOW (ref 22–32)
CREATININE: 0.83 mg/dL (ref 0.61–1.24)
Calcium: 8.5 mg/dL — ABNORMAL LOW (ref 8.9–10.3)
Glucose, Bld: 82 mg/dL (ref 65–99)
POTASSIUM: 3.4 mmol/L — AB (ref 3.5–5.1)
SODIUM: 137 mmol/L (ref 135–145)

## 2014-12-30 LAB — MAGNESIUM: MAGNESIUM: 1.8 mg/dL (ref 1.7–2.4)

## 2014-12-30 MED ORDER — POTASSIUM CHLORIDE CRYS ER 20 MEQ PO TBCR
40.0000 meq | EXTENDED_RELEASE_TABLET | Freq: Every day | ORAL | Status: AC
  Administered 2014-12-31 – 2015-01-01 (×2): 40 meq via ORAL
  Filled 2014-12-30 (×2): qty 2

## 2014-12-30 MED ORDER — KCL-LACTATED RINGERS 20 MEQ/L IV SOLN
INTRAVENOUS | Status: AC
  Administered 2014-12-30: 13:00:00 via INTRAVENOUS
  Filled 2014-12-30 (×3): qty 1000

## 2014-12-30 MED ORDER — POTASSIUM CHLORIDE CRYS ER 20 MEQ PO TBCR
40.0000 meq | EXTENDED_RELEASE_TABLET | ORAL | Status: AC
  Administered 2014-12-30 (×2): 40 meq via ORAL
  Filled 2014-12-30 (×2): qty 2

## 2014-12-30 MED ORDER — MAGNESIUM SULFATE IN D5W 10-5 MG/ML-% IV SOLN
1.0000 g | Freq: Once | INTRAVENOUS | Status: AC
  Administered 2014-12-30: 1 g via INTRAVENOUS
  Filled 2014-12-30: qty 100

## 2014-12-30 NOTE — Progress Notes (Signed)
PT Cancellation Note  Patient Details Name: Brian Adkins MRN: [id number] DOB: 1985/08/24   Cancelled Treatment:    Reason Eval/Treat Not Completed: Fatigue/lethargy limiting ability to participate.  Patient declined PT today.  Will return tomorrow for PT session.   Vena Austria 12/30/2014, 3:11 PM Durenda Hurt. Renaldo Fiddler, [facility] Acute Rehab Services Pager [phone]

## 2014-12-30 NOTE — Progress Notes (Signed)
PROGRESS NOTE  Brian Adkins:[id number] DOB: 1986/01/22 DOA: 12/22/2014 PCP: No PCP Per Patient   Brief History 29 year old male with a history of HIV/AIDS diagnosed in 2010 who has never been on any anti-retroviral medications was recently discharged from [facility] on 12/12/2014 after treatment for abdominal pain and acute kidney injury, diarrhea. During that admission, CT of the abdomen and pelvis was negative for any acute findings. He was sent home with Bactrim DS daily and fluconazole. During that admission, stool studies were positive for Cryptosporidium and norovirus. His stool cultures were negative. After discharge, the patient continued to have diarrhea. He tried Imodium which offered some help, but he continued to have watery stools without hematochezia. The patient denies any fevers, chills, chest pain, shortness of breath, coughing, hemoptysis, headaches, rashes. He has had 4 bowel movements daily. He has had nausea and vomiting daily since discharge without any hematemesis or coffee grounds. The patient has also been complaining of solid food dysphagia for 1-2 weeks. He denies any travels or raw or undercooked foods. He works at [employer]. He went to see infectious disease, Dr. Daiva Eves, who requested admission for FTT.   Subjective: Patient in bed, diarrhea somewhat improved , denies any chest or abdominal pain. No shortness of breath. No focal weakness. No headache or fever.  Assessment/Plan:  Hypotension -Due to dehydration from ongoing diarrhea and fluid loss, replace fluid changed to lactated Ringer as chloride levels are rising, TSH a.m. cortisol stable. Admit to drain.  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 difficile. LFTs and lipase are negative, ID following. For now I have placed him on  nitazoxanide on 12/29/2014, continue to monitor. Continue Imodium to reduce diarrhea.   AKI -Due to dehydration from diarrhea, improved with IV fluids continue hydration.  Hyponatremia -secondary to volume depletion and diarrhea, and a new IV fluids and monitor.    Nongapped metabolic acidosis -secondary to diarrhea    Dysphagia -no thrush on exam, clears--tolerating-->advance to soft diet-->tolerating but having intermitten dysphagia, on empiric fluconazole per ID, he had been taking once weekly fluconazole  Hypokalemia/hypomagnesemia -replete  AIDS --12/08/2014--CD4 <10 --12/08/2014 HIV RNA--75420 -continue antiretrovirals--awaiting ADAP -continue dapsone and azithromycin for prophylaxis -G6PD level--WNL   Secondary late latent syphilis -The patient received 2.4 million units benzathine penicillin at [facility], and again here on 12/29/2014. Further treatment will defer to ID.    Generalized weakness/Deconditioning -PT eval-->HHPT -OT eval-->no OT followup  Severe protein calorie malnutrition --Continue nutritional supplementation -continue marinol  Right leg pain -duplex ruled out DVT    Dispo--Home with HHPT pending clearance by ID  DVT prophylaxis on Lovenox     Procedures/Studies: Ct Abdomen Pelvis Wo Contrast  12/08/2014   CLINICAL DATA:  Left lower quadrant and bilateral flank pain. Nausea. History of ulcerative colitis.  EXAM: CT ABDOMEN AND PELVIS WITHOUT CONTRAST  TECHNIQUE: Multidetector CT imaging of the abdomen and pelvis was performed following the standard protocol without IV contrast.  COMPARISON:  None.  FINDINGS: Linear scarring in the right lung base. Left base clear. Heart is normal size. No effusions.  Paucity of intraperitoneal fat and lack of intravenous contrast somewhat limits study. Liver, gallbladder, spleen, pancreas, adrenals and kidneys have an unremarkable unenhanced appearance. No hydronephrosis.  No evidence of bowel obstruction. Stomach,  large and small bowel grossly unremarkable. No visible areas  of bowel wall thickening. No visible free fluid, free air or adenopathy. Urinary bladder is unremarkable. No acute bony abnormality.  IMPRESSION: Study somewhat limited by lack of intraperitoneal fat and IV contrast. No visible acute findings on this nonenhanced CT.   Electronically Signed   By: Charlett Nose M.D.   On: 12/08/2014 12:35   Dg Chest 2 View  12/08/2014   CLINICAL DATA:  Increasingly worse cough today  EXAM: CHEST  2 VIEW  COMPARISON:  Multiple prior studies including 12/02/2014  FINDINGS: The heart size and mediastinal contours are within normal limits. Both lungs are clear. The visualized skeletal structures are unremarkable.  IMPRESSION: No active cardiopulmonary disease.   Electronically Signed   By: Esperanza Heir M.D.   On: 12/08/2014 15:48   Dg Abd Acute W/chest  12/02/2014   CLINICAL DATA:  29 year old male with diarrhea, nausea with increase p.o. intake and mid abdominal pain cramping for the past 7 days. History of ulcerative colitis.  EXAM: DG ABDOMEN ACUTE W/ 1V CHEST  COMPARISON:  Chest x-ray 01/09/2014.  FINDINGS: Mild scarring in the periphery of the right lung base, unchanged compared to the prior examination. Lung volumes are normal. No consolidative airspace disease. No pleural effusions. No pneumothorax. No pulmonary nodule or mass noted. Pulmonary vasculature and the cardiomediastinal silhouette are within normal limits.  Air-fluid levels are present in the colon. No pathologic dilatation of small bowel or colon. No pneumoperitoneum. Calcifications projecting over the upper abdomen are favored to be pancreatic. High density material in the right lower quadrant maybe postoperative, particularly if there is prior history of partial bowel resection.  IMPRESSION: 1. Nonspecific bowel gas pattern, as above. While findings do not indicate bowel obstruction, the presence of multiple air-fluid levels in the colon is  compatible with the reported history of diarrhea, and could indicate active colitis. 2. No pneumoperitoneum. 3. No radiographic evidence of acute cardiopulmonary disease.   Electronically Signed   By: Trudie Reed M.D.   On: 12/02/2014 18:42        Objective: Filed Vitals:   12/29/14 0830 12/29/14 1633 12/29/14 2114 12/30/14 0451  BP: 89/55 91/55 89/55  84/54  Pulse: 84 85 56 84  Temp: 97.5 F (36.4 C) 98.3 F (36.8 C) 98.3 F (36.8 C) 98.3 F (36.8 C)  TempSrc: Oral Oral Oral Oral  Resp: 17 16 18 17   Height:      Weight:   46.9 kg (103 lb 6.3 oz)   SpO2: 99% 99% 96% 97%    Intake/Output Summary (Last 24 hours) at 12/30/14 0944 Last data filed at 12/30/14 0750  Gross per 24 hour  Intake 3036.25 ml  Output    450 ml  Net 2586.25 ml   Weight change: 0.1 kg (3.5 oz) Exam:   General:  Pt is alert, follows commands appropriately, not in acute distress  HEENT: No icterus, No thrush, No neck mass, [place]/AT  Cardiovascular: RRR, S1/S2, no rubs, no gallops  Respiratory: CTA bilaterally, no wheezing, no crackles, no rhonchi  Abdomen: Soft/+BS, non tender, non distended, no guarding; no hepatosplenomegaly;  Extremities: No edema, No lymphangitis, No petechiae, No rashes, no synovitis; no cyanosis or clubbing  Data Reviewed: Basic Metabolic Panel:  Recent Labs Lab 12/24/14 0459  12/26/14 0325 12/27/14 0519 12/28/14 0541 12/29/14 0506 12/29/14 0705 12/30/14 0615  NA 120*  < > 131* 133* 134* 141  --  137  K 5.7*  < > 2.8* 3.6 3.0* 3.1*  --  3.4*  CL 95*  < >  109 115* 115* 122*  --  115*  CO2 16*  < > 16* 12* 12* 12*  --  13*  GLUCOSE 92  < > 88 73 82 96  --  82  BUN 29*  < > 11 11 15 16   --  19  CREATININE 1.04  < > 0.86 0.82 0.88 0.83  --  0.83  CALCIUM 8.0*  < > 7.6* 8.1* 8.0* 8.4*  --  8.5*  MG 2.2  --   --  1.4*  --   --  1.7 1.8  < > = values in this interval not displayed. Liver Function Tests:  Recent Labs Lab 12/26/14 0325  AST 60*  ALT 25   ALKPHOS 57  BILITOT 0.6  PROT 5.1*  ALBUMIN 2.3*   No results for input(s): LIPASE, AMYLASE in the last 168 hours. No results for input(s): AMMONIA in the last 168 hours. CBC:  Recent Labs Lab 12/26/14 0325 12/29/14 0506 12/30/14 0615  WBC 2.1* 1.8* 2.1*  NEUTROABS 1.1*  --   --   HGB 10.2* 11.0* 11.6*  HCT 28.8* 30.1* 33.0*  MCV 82.1 82.7 82.5  PLT 155 211 231   Cardiac Enzymes: No results for input(s): CKTOTAL, CKMB, CKMBINDEX, TROPONINI in the last 168 hours. BNP: Invalid input(s): POCBNP CBG: No results for input(s): GLUCAP in the last 168 hours.  Recent Results (from the past 240 hour(s))  C difficile quick scan w PCR reflex     Status: None   Collection Time: 12/22/14 11:24 PM  Result Value Ref Range Status   C Diff antigen NEGATIVE NEGATIVE Final   C Diff toxin NEGATIVE NEGATIVE Final   C Diff interpretation Negative for toxigenic C. difficile  Final  Culture, blood (routine x 2)     Status: None (Preliminary result)   Collection Time: 12/27/14 12:15 PM  Result Value Ref Range Status   Specimen Description BLOOD LEFT ANTECUBITAL  Final   Special Requests BOTTLES DRAWN AEROBIC ONLY 2CC  Final   Culture NO GROWTH 2 DAYS  Final   Report Status PENDING  Incomplete  Culture, blood (routine x 2)     Status: None (Preliminary result)   Collection Time: 12/27/14 12:36 PM  Result Value Ref Range Status   Specimen Description BLOOD LEFT HAND  Final   Special Requests BOTTLES DRAWN AEROBIC ONLY 2CC  Final   Culture NO GROWTH 2 DAYS  Final   Report Status PENDING  Incomplete     Scheduled Meds: . azithromycin  1,200 mg Oral Q Wed  . dapsone  100 mg Oral Daily  . dolutegravir  50 mg Oral Daily  . dronabinol  5 mg Oral BID AC  . emtricitabine-tenofovir AF  1 tablet Oral Daily  . enoxaparin (LOVENOX) injection  40 mg Subcutaneous Q24H  . feeding supplement  1 Container Oral TID BM  . feeding supplement (ENSURE ENLIVE)  237 mL Oral BID BM  . fluconazole  200 mg  Oral Daily  . midodrine  5 mg Oral TID WC  . nitazoxanide  500 mg Oral Q12H  . sodium bicarbonate  650 mg Oral BID   Continuous Infusions:    Anti-infectives    Start     Dose/Rate Route Frequency Ordered Stop   12/30/14 1000  nitazoxanide (ALINIA) tablet 500 mg     500 mg Oral Every 12 hours 12/29/14 1225 01/13/15 0959   12/29/14 1300  nitazoxanide (ALINIA) tablet 500 mg  Status:  Discontinued  500 mg Oral Every 12 hours 12/29/14 1132 12/29/14 1225   12/29/14 1130  penicillin g benzathine (BICILLIN LA) 1200000 UNIT/2ML injection 2.4 Million Units     2.4 Million Units Intramuscular  Once 12/29/14 1126 12/29/14 1456   12/29/14 1000  azithromycin (ZITHROMAX) tablet 1,200 mg     1,200 mg Oral Every Wed 12/23/14 0819     12/27/14 0900  sulfamethoxazole-trimethoprim (BACTRIM DS,SEPTRA DS) 800-160 MG per tablet 1 tablet  Status:  Discontinued     1 tablet Oral Once per day on Mon Wed Fri 12/23/14 1610 12/25/14 1315   12/25/14 1400  dapsone tablet 100 mg     100 mg Oral Daily 12/25/14 1315     12/25/14 1400  fluconazole (DIFLUCAN) tablet 200 mg     200 mg Oral Daily 12/25/14 1315     12/24/14 1000  emtricitabine-tenofovir AF (DESCOVY) 200-25 MG per tablet 1 tablet     1 tablet Oral Daily 12/23/14 1746     12/23/14 1000  sulfamethoxazole-trimethoprim (BACTRIM DS,SEPTRA DS) 800-160 MG per tablet 1 tablet  Status:  Discontinued     1 tablet Oral Daily 12/22/14 1753 12/23/14 1610   12/22/14 2245  azithromycin (ZITHROMAX) tablet 1,200 mg  Status:  Discontinued     1,200 mg Oral Every 7 days 12/22/14 1753 12/23/14 0819   12/22/14 2145  dolutegravir (TIVICAY) tablet 50 mg     50 mg Oral Daily 12/22/14 2131     12/22/14 2131  emtricitabine-tenofovir (TRUVADA) 200-300 MG per tablet 1 tablet  Status:  Discontinued     1 tablet Oral Daily 12/22/14 2131 12/23/14 1746        SINGH,PRASHANT K M.D on 12/30/2014 at 9:44 AM  Between 7am to 7pm - Pager - [phone], After 7pm go to [email or web address]  - password [facility]  [facility]  - Office  [phone]   LOS: 8 days

## 2014-12-30 NOTE — Progress Notes (Signed)
Nutrition Follow Up  DOCUMENTATION CODES:   Severe malnutrition in context of chronic illness, Underweight  INTERVENTION:    Continue Boost Breeze po TID, each supplement provides 250 kcal and 9 grams of protein   Continue Ensure Enlive po BID, each supplement provides 350 kcal and 20 grams of protein  NUTRITION DIAGNOSIS:   Malnutrition related to chronic illness as evidenced by percent weight loss, severe depletion of body fat, severe depletion of muscle mass, ongoing  GOAL:   Patient will meet greater than or equal to 90% of their needs, unmet  MONITOR:   PO intake, Supplement acceptance, Labs, Weight trends, I & O's  ASSESSMENT:   29 year old male with a history of HIV/AIDS diagnosed in 2010 who has never been on any anti-retroviral medications was recently discharged from [facility] on 12/12/2014 after treatment for abdominal pain and acute kidney injury, diarrhea.  Pt continues on a Soft diet.  Appetite poor.  PO intake 0% per flowsheet records.  On Marinol.  Diarrhea somewhat improved.  Malnutrition ongoing.  Receiving Boost Breeze and Ensure Enlive oral nutrition supplements.  Disposition: Home with HHPT.  Diet Order:  DIET SOFT Room service appropriate?: Yes; Fluid consistency:: Thin  Skin:  Reviewed, no issues  Last BM:  9/7  Height:   Ht Readings from Last 1 Encounters:  12/25/14  (1.575 m)    Weight:   Wt Readings from Last 1 Encounters:  12/29/14 103 lb 6.3 oz (46.9 kg)    Ideal Body Weight:  53.6 kg  BMI:  Body mass index is 18.91 kg/(m^2).  Estimated Nutritional Needs:   Kcal:  1600-1800  Protein:  65-80 grams  Fluid:  1.6 - 1.8 L/day  EDUCATION NEEDS:   No education needs identified at this time  Maureen Chatters, RD, LDN Pager #: [phone] After-Hours Pager #: [phone]

## 2014-12-30 NOTE — Progress Notes (Addendum)
INFECTIOUS DISEASE PROGRESS NOTE  ID: Brian Adkins is a 29 y.o. male with  Active Problems:   Hyponatremia   Diarrhea   Human immunodeficiency virus (HIV) disease   AIDS   Gastroenteritis due to Cryptosporidium   Failure to thrive in adult   Dysphagia   Dehydration   Protein-calorie malnutrition, severe   Late syphilis  Subjective: No loose BM today Eating ok.  Abtx:  Anti-infectives    Start     Dose/Rate Route Frequency Ordered Stop   12/30/14 1000  nitazoxanide (ALINIA) tablet 500 mg     500 mg Oral Every 12 hours 12/29/14 1225 01/13/15 0959   12/29/14 1300  nitazoxanide (ALINIA) tablet 500 mg  Status:  Discontinued     500 mg Oral Every 12 hours 12/29/14 1132 12/29/14 1225   12/29/14 1130  penicillin g benzathine (BICILLIN LA) 1200000 UNIT/2ML injection 2.4 Million Units     2.4 Million Units Intramuscular  Once 12/29/14 1126 12/29/14 1456   12/29/14 1000  azithromycin (ZITHROMAX) tablet 1,200 mg     1,200 mg Oral Every Wed 12/23/14 0819     12/27/14 0900  sulfamethoxazole-trimethoprim (BACTRIM DS,SEPTRA DS) 800-160 MG per tablet 1 tablet  Status:  Discontinued     1 tablet Oral Once per day on Mon Wed Fri 12/23/14 1610 12/25/14 1315   12/25/14 1400  dapsone tablet 100 mg     100 mg Oral Daily 12/25/14 1315     12/25/14 1400  fluconazole (DIFLUCAN) tablet 200 mg     200 mg Oral Daily 12/25/14 1315     12/24/14 1000  emtricitabine-tenofovir AF (DESCOVY) 200-25 MG per tablet 1 tablet     1 tablet Oral Daily 12/23/14 1746     12/23/14 1000  sulfamethoxazole-trimethoprim (BACTRIM DS,SEPTRA DS) 800-160 MG per tablet 1 tablet  Status:  Discontinued     1 tablet Oral Daily 12/22/14 1753 12/23/14 1610   12/22/14 2245  azithromycin (ZITHROMAX) tablet 1,200 mg  Status:  Discontinued     1,200 mg Oral Every 7 days 12/22/14 1753 12/23/14 0819   12/22/14 2145  dolutegravir (TIVICAY) tablet 50 mg     50 mg Oral Daily 12/22/14 2131     12/22/14 2131  emtricitabine-tenofovir  (TRUVADA) 200-300 MG per tablet 1 tablet  Status:  Discontinued     1 tablet Oral Daily 12/22/14 2131 12/23/14 1746      Medications:  Scheduled: . azithromycin  1,200 mg Oral Q Wed  . dapsone  100 mg Oral Daily  . dolutegravir  50 mg Oral Daily  . dronabinol  5 mg Oral BID AC  . emtricitabine-tenofovir AF  1 tablet Oral Daily  . enoxaparin (LOVENOX) injection  40 mg Subcutaneous Q24H  . feeding supplement  1 Container Oral TID BM  . feeding supplement (ENSURE ENLIVE)  237 mL Oral BID BM  . fluconazole  200 mg Oral Daily  . midodrine  5 mg Oral TID WC  . nitazoxanide  500 mg Oral Q12H  . potassium chloride  40 mEq Oral Q4H  . [START ON 12/31/2014] potassium chloride  40 mEq Oral Daily  . sodium bicarbonate  650 mg Oral BID    Objective: Vital signs in last 24 hours: Temp:  [98.3 F (36.8 C)] 98.3 F (36.8 C) (09/08 1016) Pulse Rate:  [56-90] 90 (09/08 1016) Resp:  [16-18] 18 (09/08 1016) BP: (84-91)/(54-56) 89/56 mmHg (09/08 1016) SpO2:  [7 %-99 %] 7 % (09/08 1016) Weight:  [46.9  kg (103 lb 6.3 oz)] 46.9 kg (103 lb 6.3 oz) (09/07 2114)   General appearance: alert, cooperative and cachectic Resp: clear to auscultation bilaterally Cardio: regular rate and rhythm GI: normal findings: bowel sounds normal and soft, non-tender  Lab Results  Recent Labs  12/29/14 0506 12/30/14 0615  WBC 1.8* 2.1*  HGB 11.0* 11.6*  HCT 30.1* 33.0*  NA 141 137  K 3.1* 3.4*  CL 122* 115*  CO2 12* 13*  BUN 16 19  CREATININE 0.83 0.83   Liver Panel No results for input(s): PROT, ALBUMIN, AST, ALT, ALKPHOS, BILITOT, BILIDIR, IBILI in the last 72 hours. Sedimentation Rate No results for input(s): ESRSEDRATE in the last 72 hours. C-Reactive Protein No results for input(s): CRP in the last 72 hours.  Microbiology: Recent Results (from the past 240 hour(s))  C difficile quick scan w PCR reflex     Status: None   Collection Time: 12/22/14 11:24 PM  Result Value Ref Range Status   C  Diff antigen NEGATIVE NEGATIVE Final   C Diff toxin NEGATIVE NEGATIVE Final   C Diff interpretation Negative for toxigenic C. difficile  Final  Culture, blood (routine x 2)     Status: None (Preliminary result)   Collection Time: 12/27/14 12:15 PM  Result Value Ref Range Status   Specimen Description BLOOD LEFT ANTECUBITAL  Final   Special Requests BOTTLES DRAWN AEROBIC ONLY 2CC  Final   Culture NO GROWTH 3 DAYS  Final   Report Status PENDING  Incomplete  Culture, blood (routine x 2)     Status: None (Preliminary result)   Collection Time: 12/27/14 12:36 PM  Result Value Ref Range Status   Specimen Description BLOOD LEFT HAND  Final   Special Requests BOTTLES DRAWN AEROBIC ONLY 2CC  Final   Culture NO GROWTH 3 DAYS  Final   Report Status PENDING  Incomplete    Studies/Results: No results found.   Assessment/Plan: HIV/AIDS -Continue descovy, tivicay  Dysphagia - appears to be responding to azole. Aim for 14 days  Cryptosporidia diarrhea -watch for response on ART.   - Nitazoximide day would aim for 14 days. Today is day 1.   Syphilis  tx completed. - PEN injection on 9-8  Failure to thrive - nutrition support.  - hypotension continues.  - eating well by his report.   Would he benefit from SNF? Available as needed.          Brian Adkins Infectious Diseases (pager) [phone] [email or web address] 12/30/2014, 2:27 PM  LOS: 8 days

## 2014-12-31 LAB — POTASSIUM: POTASSIUM: 3.5 mmol/L (ref 3.5–5.1)

## 2014-12-31 MED ORDER — SODIUM CHLORIDE 0.9 % IV BOLUS (SEPSIS)
250.0000 mL | Freq: Once | INTRAVENOUS | Status: AC
  Administered 2014-12-31: 250 mL via INTRAVENOUS

## 2014-12-31 MED ORDER — NITAZOXANIDE 500 MG PO TABS: 500.0000 mg | ORAL_TABLET | Freq: Two times a day (BID) | ORAL | Status: DC

## 2014-12-31 MED ORDER — POTASSIUM CHLORIDE CRYS ER 20 MEQ PO TBCR
40.0000 meq | EXTENDED_RELEASE_TABLET | Freq: Once | ORAL | Status: AC
  Administered 2014-12-31: 40 meq via ORAL
  Filled 2014-12-31: qty 2

## 2014-12-31 MED ORDER — SODIUM CHLORIDE 0.9 % IV SOLN
INTRAVENOUS | Status: AC
  Administered 2014-12-31: 10:00:00 via INTRAVENOUS

## 2014-12-31 NOTE — Progress Notes (Signed)
PROGRESS NOTE  BURNHAM TROST QMV:[id number] DOB: 02-12-1986 DOA: 12/22/2014 PCP: No PCP Per Patient   Brief History 29 year old male with a history of HIV/AIDS diagnosed in 2010 who has never been on any anti-retroviral medications was recently discharged from [facility] on 12/12/2014 after treatment for abdominal pain and acute kidney injury, diarrhea. During that admission, CT of the abdomen and pelvis was negative for any acute findings. He was sent home with Bactrim DS daily and fluconazole. During that admission, stool studies were positive for Cryptosporidium and norovirus. His stool cultures were negative. After discharge, the patient continued to have diarrhea. He tried Imodium which offered some help, but he continued to have watery stools without hematochezia. The patient denies any fevers, chills, chest pain, shortness of breath, coughing, hemoptysis, headaches, rashes. He has had 4 bowel movements daily. He has had nausea and vomiting daily since discharge without any hematemesis or coffee grounds. The patient has also been complaining of solid food dysphagia for 1-2 weeks. He denies any travels or raw or undercooked foods. He works at [employer]. He went to see infectious disease, Dr. Daiva Eves, who requested admission for FTT.   Subjective: Patient in bed, diarrhea somewhat improved , denies any chest or abdominal pain. No shortness of breath. No focal weakness. No headache or fever. He feels much better today  Assessment/Plan:  Hypotension -Due to dehydration from ongoing diarrhea and fluid loss, with hydration blood pressure is improved he is currently not orthostatics, midodrine added, TSH along with a.m. cortisol stable. Apply TED stockings, increase activity if stable discharge in the morning.   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 difficile. LFTs and lipase are negative, ID following. For now I have placed him on nitazoxanide on 12/29/2014 stop date 01/13/2015 per ID, continue to monitor. Continue Imodium as needed overall diarrhea better patient feels better.  AKI -Due to dehydration from diarrhea, improved with IV fluids continue hydration.  Hyponatremia -secondary to volume depletion and diarrhea, and a new IV fluids and monitor.   Nongap metabolic acidosis -secondary to diarrhea  supportive care.  Dysphagia -no thrush on exam, clears--tolerating-->advance to soft diet-->tolerating but having intermitten dysphagia, on empiric fluconazole per ID, he had been taking once weekly fluconazole  Hypokalemia/hypomagnesemia -replete  AIDS --12/08/2014--CD4 <10, 12/08/2014 HIV RNA--75420 -continue antiretroviralswill defer treatment to ID. -continue dapsone and azithromycin for prophylaxis, G6PD level--WNL   Secondary late latent syphilis -The patient received 2.4 million units benzathine penicillin at [facility], and again here on 12/29/2014. As per ID he has finished his treatment    Generalized weakness/Deconditioning -PT eval-->HHPT -OT eval-->no OT followup  Severe protein calorie malnutrition --Continue nutritional supplementation -continue marinol  Right leg pain -duplex ruled out DVT    Dispo--Home with HHPT in am  DVT prophylaxis on Lovenox     Procedures/Studies: Ct Abdomen Pelvis Wo Contrast  12/08/2014   CLINICAL DATA:  Left lower quadrant and bilateral flank pain. Nausea. History of ulcerative colitis.  EXAM: CT ABDOMEN AND PELVIS WITHOUT CONTRAST  TECHNIQUE: Multidetector CT imaging of the abdomen and pelvis was performed following the standard protocol without IV contrast.  COMPARISON:  None.  FINDINGS: Linear scarring in the right lung base. Left base clear. Heart is normal size. No effusions.  Paucity of intraperitoneal fat and lack of intravenous contrast  somewhat limits study.  Liver, gallbladder, spleen, pancreas, adrenals and kidneys have an unremarkable unenhanced appearance. No hydronephrosis.  No evidence of bowel obstruction. Stomach, large and small bowel grossly unremarkable. No visible areas of bowel wall thickening. No visible free fluid, free air or adenopathy. Urinary bladder is unremarkable. No acute bony abnormality.  IMPRESSION: Study somewhat limited by lack of intraperitoneal fat and IV contrast. No visible acute findings on this nonenhanced CT.   Electronically Signed   By: Charlett Nose M.D.   On: 12/08/2014 12:35   Dg Chest 2 View  12/08/2014   CLINICAL DATA:  Increasingly worse cough today  EXAM: CHEST  2 VIEW  COMPARISON:  Multiple prior studies including 12/02/2014  FINDINGS: The heart size and mediastinal contours are within normal limits. Both lungs are clear. The visualized skeletal structures are unremarkable.  IMPRESSION: No active cardiopulmonary disease.   Electronically Signed   By: Esperanza Heir M.D.   On: 12/08/2014 15:48   Dg Abd Acute W/chest  12/02/2014   CLINICAL DATA:  29 year old male with diarrhea, nausea with increase p.o. intake and mid abdominal pain cramping for the past 7 days. History of ulcerative colitis.  EXAM: DG ABDOMEN ACUTE W/ 1V CHEST  COMPARISON:  Chest x-ray 01/09/2014.  FINDINGS: Mild scarring in the periphery of the right lung base, unchanged compared to the prior examination. Lung volumes are normal. No consolidative airspace disease. No pleural effusions. No pneumothorax. No pulmonary nodule or mass noted. Pulmonary vasculature and the cardiomediastinal silhouette are within normal limits.  Air-fluid levels are present in the colon. No pathologic dilatation of small bowel or colon. No pneumoperitoneum. Calcifications projecting over the upper abdomen are favored to be pancreatic. High density material in the right lower quadrant maybe postoperative, particularly if there is prior history of partial  bowel resection.  IMPRESSION: 1. Nonspecific bowel gas pattern, as above. While findings do not indicate bowel obstruction, the presence of multiple air-fluid levels in the colon is compatible with the reported history of diarrhea, and could indicate active colitis. 2. No pneumoperitoneum. 3. No radiographic evidence of acute cardiopulmonary disease.   Electronically Signed   By: Trudie Reed M.D.   On: 12/02/2014 18:42        Objective: Filed Vitals:   12/30/14 1718 12/30/14 2031 12/31/14 0510 12/31/14 0759  BP: 93/59 94/58 85/50    Pulse: 85 75 95 86  Temp: 98.7 F (37.1 C) 97.8 F (36.6 C) 98.2 F (36.8 C) 98.3 F (36.8 C)  TempSrc: Oral Oral Oral Oral  Resp: 18 17 16 17   Height:      Weight:  47.1 kg (103 lb 13.4 oz)    SpO2: 98% 96% 93%     Intake/Output Summary (Last 24 hours) at 12/31/14 0936 Last data filed at 12/31/14 0835  Gross per 24 hour  Intake 1686.25 ml  Output      0 ml  Net 1686.25 ml   Weight change: 0.2 kg (7.1 oz) Exam:   General:  Pt is alert, follows commands appropriately, not in acute distress  HEENT: No icterus, No thrush, No neck mass, [place]/AT  Cardiovascular: RRR, S1/S2, no rubs, no gallops  Respiratory: CTA bilaterally, no wheezing, no crackles, no rhonchi  Abdomen: Soft/+BS, non tender, non distended, no guarding; no hepatosplenomegaly;  Extremities: No edema, No lymphangitis, No petechiae, No rashes, no synovitis; no cyanosis or clubbing  Data Reviewed: Basic Metabolic Panel:  Recent Labs Lab 12/26/14 0325 12/27/14 0519 12/28/14 0541 12/29/14 1610 12/29/14 9604 12/30/14 0615 12/31/14 0715  NA 131* 133* 134* 141  --  137  --   K 2.8* 3.6 3.0* 3.1*  --  3.4* 3.5  CL 109 115* 115* 122*  --  115*  --   CO2 16* 12* 12* 12*  --  13*  --   GLUCOSE 88 73 82 96  --  82  --   BUN 11 11 15 16   --  19  --   CREATININE 0.86 0.82 0.88 0.83  --  0.83  --   CALCIUM 7.6* 8.1* 8.0* 8.4*  --  8.5*  --   MG  --  1.4*  --   --  1.7 1.8  --     Liver Function Tests:  Recent Labs Lab 12/26/14 0325  AST 60*  ALT 25  ALKPHOS 57  BILITOT 0.6  PROT 5.1*  ALBUMIN 2.3*   No results for input(s): LIPASE, AMYLASE in the last 168 hours. No results for input(s): AMMONIA in the last 168 hours. CBC:  Recent Labs Lab 12/26/14 0325 12/29/14 0506 12/30/14 0615  WBC 2.1* 1.8* 2.1*  NEUTROABS 1.1*  --   --   HGB 10.2* 11.0* 11.6*  HCT 28.8* 30.1* 33.0*  MCV 82.1 82.7 82.5  PLT 155 211 231   Cardiac Enzymes: No results for input(s): CKTOTAL, CKMB, CKMBINDEX, TROPONINI in the last 168 hours. BNP: Invalid input(s): POCBNP CBG: No results for input(s): GLUCAP in the last 168 hours.  Recent Results (from the past 240 hour(s))  C difficile quick scan w PCR reflex     Status: None   Collection Time: 12/22/14 11:24 PM  Result Value Ref Range Status   C Diff antigen NEGATIVE NEGATIVE Final   C Diff toxin NEGATIVE NEGATIVE Final   C Diff interpretation Negative for toxigenic C. difficile  Final  Culture, blood (routine x 2)     Status: None (Preliminary result)   Collection Time: 12/27/14 12:15 PM  Result Value Ref Range Status   Specimen Description BLOOD LEFT ANTECUBITAL  Final   Special Requests BOTTLES DRAWN AEROBIC ONLY 2CC  Final   Culture NO GROWTH 3 DAYS  Final   Report Status PENDING  Incomplete  Culture, blood (routine x 2)     Status: None (Preliminary result)   Collection Time: 12/27/14 12:36 PM  Result Value Ref Range Status   Specimen Description BLOOD LEFT HAND  Final   Special Requests BOTTLES DRAWN AEROBIC ONLY 2CC  Final   Culture NO GROWTH 3 DAYS  Final   Report Status PENDING  Incomplete     Scheduled Meds: . azithromycin  1,200 mg Oral Q Wed  . dapsone  100 mg Oral Daily  . dolutegravir  50 mg Oral Daily  . dronabinol  5 mg Oral BID AC  . emtricitabine-tenofovir AF  1 tablet Oral Daily  . enoxaparin (LOVENOX) injection  40 mg Subcutaneous Q24H  . feeding supplement  1 Container Oral TID BM  .  feeding supplement (ENSURE ENLIVE)  237 mL Oral BID BM  . fluconazole  200 mg Oral Daily  . midodrine  5 mg Oral TID WC  . nitazoxanide  500 mg Oral Q12H  . potassium chloride  40 mEq Oral Daily  . sodium bicarbonate  650 mg Oral BID   Continuous Infusions: . sodium chloride    . lactated ringers with KCl 20 mEq/L 75 mL/hr at 12/30/14 1244    Anti-infectives    Start     Dose/Rate Route Frequency Ordered Stop  12/30/14 1000  nitazoxanide (ALINIA) tablet 500 mg     500 mg Oral Every 12 hours 12/29/14 1225 01/13/15 0959   12/29/14 1300  nitazoxanide (ALINIA) tablet 500 mg  Status:  Discontinued     500 mg Oral Every 12 hours 12/29/14 1132 12/29/14 1225   12/29/14 1130  penicillin g benzathine (BICILLIN LA) 1200000 UNIT/2ML injection 2.4 Million Units     2.4 Million Units Intramuscular  Once 12/29/14 1126 12/29/14 1456   12/29/14 1000  azithromycin (ZITHROMAX) tablet 1,200 mg     1,200 mg Oral Every Wed 12/23/14 0819     12/27/14 0900  sulfamethoxazole-trimethoprim (BACTRIM DS,SEPTRA DS) 800-160 MG per tablet 1 tablet  Status:  Discontinued     1 tablet Oral Once per day on Mon Wed Fri 12/23/14 1610 12/25/14 1315   12/25/14 1400  dapsone tablet 100 mg     100 mg Oral Daily 12/25/14 1315     12/25/14 1400  fluconazole (DIFLUCAN) tablet 200 mg     200 mg Oral Daily 12/25/14 1315     12/24/14 1000  emtricitabine-tenofovir AF (DESCOVY) 200-25 MG per tablet 1 tablet     1 tablet Oral Daily 12/23/14 1746     12/23/14 1000  sulfamethoxazole-trimethoprim (BACTRIM DS,SEPTRA DS) 800-160 MG per tablet 1 tablet  Status:  Discontinued     1 tablet Oral Daily 12/22/14 1753 12/23/14 1610   12/22/14 2245  azithromycin (ZITHROMAX) tablet 1,200 mg  Status:  Discontinued     1,200 mg Oral Every 7 days 12/22/14 1753 12/23/14 0819   12/22/14 2145  dolutegravir (TIVICAY) tablet 50 mg     50 mg Oral Daily 12/22/14 2131     12/22/14 2131  emtricitabine-tenofovir (TRUVADA) 200-300 MG per tablet 1 tablet   Status:  Discontinued     1 tablet Oral Daily 12/22/14 2131 12/23/14 1746        SINGH,PRASHANT K M.D on 12/31/2014 at 9:36 AM  Between 7am to 7pm - Pager - [phone], After 7pm go to [email or web address] - password [facility]  [facility]  - Office  [phone]   LOS: 9 days

## 2014-12-31 NOTE — Progress Notes (Signed)
Marissa notified, pt refused orthostatic vitals

## 2014-12-31 NOTE — Care Management Note (Signed)
Case Management Note  Patient Details  Name: Brian Adkins MRN: [id number] Date of Birth: [birth year]  Subjective/Objective:    CM following for progression and d/c planning.                Action/Plan: 12/31/2014 Met with pt and [facility] notified of need for [facility] services and will provide HHRN only. Pt will need Alinia 523m BID until 01/13/2015 attempting to locate this medication for the pt and [facility] provide [facility] this pt to obtain this medication. Override for this medication approved by NMagdalene Patricia, pharmacist. After multiple calls it was determined that no local pharmacies have [place] and all will need to order, therefore the pt will be dispensed 5 tabs on Sat from the main pharmacy and will [place] the remainer on Monday [place]  Prescription for the 5 capsules to complete the doses for Sat ,Sunday and Monday may be [place] [facility] [place] floor [place] at the time of d/c and given to the pt . The outpt pharmacy will order the remainder of the med and dispense to the pt on Monday. No DME needs per pt.   Expected Discharge Date:        01/01/2015          Expected Discharge Plan:  [facility]-House Referral:  Clinical Social Work  Discharge planning Services  CM Consult  Post Acute Care Choice:    Choice offered to:     DME Arranged:  N/A DME Agency:     HH Arranged:  [place]  [place] Status of Service:  Completed, signed off  Medicare Important Message Given:    Date Medicare IM Given:    Medicare IM give by:    Date Additional Medicare IM Given:    Additional Medicare Important Message give by:     If discussed at [place] Meet[place], dates discussed:    Additional Comments:  RAdron Bene RN 12/31/2014, 3:43 PM

## 2014-12-31 NOTE — Progress Notes (Signed)
Physical Therapy Treatment Patient Details Name: Brian Adkins MRN: [id number] DOB: 07-02-1985 Today's Date: 2015/01/12    History of Present Illness  Patient is a 29 yo male admitted 12/22/14 with continued N/V and diarrhea secondary to cryptosporidium and norovirus. Patient with dehydration and FTT. Rt calf pain tested negative for DVT. PMH includes HIV/AIDS, CAP, Syphilis, pericardial effusion, MRSA, Ulcerative colitis, empyema, anemia     PT Comments    Patient with improved balance and activity tolerance today.  Will have assist at home, as well as f/u HHPT.  Follow Up Recommendations  Home health PT;Supervision - Intermittent     Equipment Recommendations  None recommended by PT    Recommendations for Other Services       Precautions / Restrictions Precautions Precautions: Fall Precaution Comments: fall risk is slight Restrictions Weight Bearing Restrictions: No    Mobility  Bed Mobility                  Transfers Overall transfer level: Needs assistance Equipment used: None Transfers: Sit to/from Stand Sit to Stand: Supervision         General transfer comment: Supervision for safety only.  Ambulation/Gait Ambulation/Gait assistance: Supervision Ambulation Distance (Feet): 65 Feet Assistive device: None Gait Pattern/deviations: Step-through pattern;Decreased stride length Gait velocity: decreased Gait velocity interpretation: Below normal speed for age/gender General Gait Details: Slow, guarded gait.  Slightly unsteady, able to self correct.  No loss of balance.   Stairs            Wheelchair Mobility    Modified Rankin (Stroke Patients Only)       Balance                                    Cognition Arousal/Alertness: Awake/alert Behavior During Therapy: Flat affect Overall Cognitive Status: Within Functional Limits for tasks assessed                      Exercises      General Comments         Pertinent Vitals/Pain Pain Assessment: No/denies pain    Home Living                      Prior Function            PT Goals (current goals can now be found in the care plan section) Progress towards PT goals: Progressing toward goals    Frequency  Min 3X/week    PT Plan Current plan remains appropriate    Co-evaluation             End of Session   Activity Tolerance: Patient tolerated treatment well;Patient limited by fatigue Patient left: in chair;with call bell/phone within reach     Time: 1152-1201 PT Time Calculation (min) (ACUTE ONLY): 9 min  Charges:  $Gait Training: 8-22 mins                    G Codes:      Vena Austria 01-12-15, 1:13 PM Durenda Hurt. Renaldo Fiddler, [facility] Acute Rehab Services Pager [phone]

## 2015-01-01 LAB — BASIC METABOLIC PANEL WITH GFR
Anion gap: 9 (ref 5–15)
BUN: 17 mg/dL (ref 6–20)
CO2: 15 mmol/L — ABNORMAL LOW (ref 22–32)
Calcium: 8 mg/dL — ABNORMAL LOW (ref 8.9–10.3)
Chloride: 113 mmol/L — ABNORMAL HIGH (ref 101–111)
Creatinine, Ser: 0.79 mg/dL (ref 0.61–1.24)
GFR calc Af Amer: >60 mL/min
GFR calc non Af Amer: >60 mL/min
Glucose, Bld: 96 mg/dL (ref 65–99)
Potassium: 3.8 mmol/L (ref 3.5–5.1)
Sodium: 137 mmol/L (ref 135–145)

## 2015-01-01 LAB — CULTURE, BLOOD (ROUTINE X 2)
CULTURE: NO GROWTH
CULTURE: NO GROWTH

## 2015-01-01 LAB — MAGNESIUM: MAGNESIUM: 2.1 mg/dL (ref 1.7–2.4)

## 2015-01-01 MED ORDER — SULFAMETHOXAZOLE-TRIMETHOPRIM 800-160 MG PO TABS: 1.0000 | ORAL_TABLET | Freq: Two times a day (BID) | ORAL | Status: AC

## 2015-01-01 MED ORDER — FLUCONAZOLE 100 MG PO TABS: 100.0000 mg | ORAL_TABLET | ORAL | Status: AC

## 2015-01-01 MED ORDER — BOOST / RESOURCE BREEZE PO LIQD: 1.0000 | Freq: Three times a day (TID) | ORAL | Status: AC

## 2015-01-01 MED ORDER — NITAZOXANIDE 500 MG PO TABS: 500.0000 mg | ORAL_TABLET | Freq: Two times a day (BID) | ORAL | Status: AC

## 2015-01-01 MED ORDER — DAPSONE 100 MG PO TABS: 100.0000 mg | ORAL_TABLET | Freq: Every day | ORAL | Status: AC

## 2015-01-01 MED ORDER — MIDODRINE HCL 5 MG PO TABS: 5.0000 mg | ORAL_TABLET | Freq: Three times a day (TID) | ORAL | Status: AC

## 2015-01-01 MED ORDER — LOPERAMIDE HCL 2 MG PO CAPS: 2.0000 mg | ORAL_CAPSULE | Freq: Four times a day (QID) | ORAL | Status: AC | PRN

## 2015-01-01 MED ORDER — AZITHROMYCIN 600 MG PO TABS: 1200.0000 mg | ORAL_TABLET | ORAL | Status: AC

## 2015-01-01 NOTE — Discharge Instructions (Signed)
Follow with Primary MD in 7 days   Get CBC, CMP, 2 view Chest X ray checked  by Primary MD next visit.    Activity: As tolerated with Full fall precautions use walker/cane & assistance as needed   Disposition Home     Diet: Regular.  For Heart failure patients - Check your Weight same time everyday, if you gain over 2 pounds, or you develop in leg swelling, experience more shortness of breath or chest pain, call your Primary MD immediately. Follow Cardiac Low Salt Diet and 1.5 lit/day fluid restriction.   On your next visit with your primary care physician please Get Medicines reviewed and adjusted.   Please request your Prim.MD to go over all Hospital Tests and Procedure/Radiological results at the follow up, please get all Hospital records sent to your Prim MD by signing hospital release before you go home.   If you experience worsening of your admission symptoms, develop shortness of breath, life threatening emergency, suicidal or homicidal thoughts you must seek medical attention immediately by calling 911 or calling your MD immediately  if symptoms less severe.  You Must read complete instructions/literature along with all the possible adverse reactions/side effects for all the Medicines you take and that have been prescribed to you. Take any new Medicines after you have completely understood and accpet all the possible adverse reactions/side effects.   Do not drive, operating heavy machinery, perform activities at heights, swimming or participation in water activities or provide baby sitting services if your were admitted for syncope or siezures until you have seen by Primary MD or a Neurologist and advised to do so again.  Do not drive when taking Pain medications.    Do not take more than prescribed Pain, Sleep and Anxiety Medications  Special Instructions: If you have smoked or chewed Tobacco  in the last 2 yrs please stop smoking, stop any regular Alcohol  and or any  Recreational drug use.  Wear Seat belts while driving.   Please note  You were cared for by a hospitalist during your hospital stay. If you have any questions about your discharge medications or the care you received while you were in the hospital after you are discharged, you can call the unit and asked to speak with the hospitalist on call if the hospitalist that took care of you is not available. Once you are discharged, your primary care physician will handle any further medical issues. Please note that NO REFILLS for any discharge medications will be authorized once you are discharged, as it is imperative that you return to your primary care physician (or establish a relationship with a primary care physician if you do not have one) for your aftercare needs so that they can reassess your need for medications and monitor your lab values.

## 2015-01-01 NOTE — Discharge Summary (Signed)
Brian Adkins, is a 29 y.o. male  DOB [birth year]  MRN [id number].  Admission date:  12/22/2014  Admitting Physician  Penny Pia, MD  Discharge Date:  01/01/2015   Primary MD  No PCP Per Patient  Recommendations for primary care physician for things to follow:   Monitor CBC, BMP, CD4 count, HIV viral load. Needs close outpatient ID follow-up.   Admission Diagnosis  Failure To Thrive 042   Discharge Diagnosis  Failure To Thrive 042     Active Problems:   Hyponatremia   Diarrhea   Human immunodeficiency virus (HIV) disease   AIDS   Gastroenteritis due to Cryptosporidium   Failure to thrive in adult   Dysphagia   Dehydration   Protein-calorie malnutrition, severe   Late syphilis      Past Medical History  Diagnosis Date  . Ulcerative colitis     diagnosed 2010, left sided.  . Empyema     2010  . GI bleed   . MRSA bacteremia   . HIV (human immunodeficiency virus infection) dx'd 2010    patient never follow up with ID  . Anemia   . Pericardial effusion   . Gastroenteritis due to Cryptosporidium 12/11/2014  . Gastroenteritis due to norovirus 12/11/2014  . Syphilis 12/12/2014  . Failure to thrive in adult 12/22/2014  . Dysphagia 12/22/2014  . Right calf pain 12/22/2014  . CAP (community acquired pneumonia) 2012  . History of blood transfusion 2010    "losing blood from my rectum"    Past Surgical History  Procedure Laterality Date  . Thoracentesis  2010?  [facility] Kitchen Colonoscopy  2010    Dr. Jena Gauss. Left-sided proctocolitis       HPI  :     29 year old male with a history of HIV/AIDS diagnosed in 2010 who has never been on any anti-retroviral medications was recently discharged from [facility] on 12/12/2014 after treatment for abdominal pain and acute kidney injury, diarrhea. During that admission, CT  of the abdomen and pelvis was negative for any acute findings. He was sent home with Bactrim DS daily and fluconazole. During that admission, stool studies were positive for Cryptosporidium and norovirus. His stool cultures were negative. After discharge, the patient continued to have diarrhea. He tried Imodium which offered some help, but he continued to have watery stools without hematochezia. The patient denies any fevers, chills, chest pain, shortness of breath, coughing, hemoptysis, headaches, rashes. He has had 4 bowel movements daily. He has had nausea and vomiting daily since discharge without any hematemesis or coffee grounds. The patient has also been complaining of solid food dysphagia for 1-2 weeks. He denies any travels or raw or undercooked foods. He works at [employer]. He went to see infectious disease, Dr. Daiva Eves, who requested admission for FTT.    Hospital Course:     Ongoing diarrhea due to Cryptosporidium enteritis with Dehydration/failure to thrive and hypotension -Secondary to diarrhea caused by Cryptosporidium and norovirus, negative for C. difficile. LFTs and lipase were negative,  ID was following. He has been placed on nitazoxanide on 12/29/2014 stop date 01/13/2015 per ID, along with Imodium as needed, overall diarrhea improved he is feeling better blood pressures have improved. He is tolerating regular diet and eager to go home, will be discharged with outpatient ID follow-up. Anti-fungal medication arranged by case management for 2 weeks. He already has his HIV medications at home along with his Bactrim and azithromycin.  He has promised to follow with infectious disease physician Dr. Orvan Falconer within a week.   Hypotension -Due to dehydration from ongoing diarrhea and fluid loss, with hydration blood pressure is improved he is currently not orthostatics, midodrine added, TSH along with a.m. cortisol stable. Applied TED stockings, her pressures have improved this  evening, he has ambulated in the hallway without any distress, will be discharged home this morning.   AKI -Due to dehydration from diarrhea, improved with hydration.  Hyponatremia -secondary to volume depletion and diarrhea, resolved after hydration.  Nongap metabolic acidosis -secondary to diarrhea supportive care.  Dysphagia -no thrush on exam, clears--tolerating-->advance to soft diet-->tolerating but having intermitten dysphagia, on empiric fluconazole per ID, he had been taking once weekly fluconazole  Hypokalemia/hypomagnesemia -repleted  AIDS --12/08/2014--CD4 <10, 12/08/2014 HIV RNA--75420 -continue antiretroviralswill defer treatment to ID. -continue dapsone, Bactrim and azithromycin for prophylaxis, G6PD level--WNL Will follow with ID within a week.   Secondary late latent syphilis -The patient received 2.4 million units benzathine penicillin at [facility], and again here on 12/29/2014. As per ID he has finished his treatment   Generalized weakness/Deconditioning -PT eval-->HHPT -OT eval-->no OT followup  Severe protein calorie malnutrition --Continue nutritional supplementation -continue marinol  Right leg pain -duplex ruled out DVT        Discharge Condition: Fair  Follow UP  Follow-up Information    Follow [facility] with [facility]             . Schedule an appointment as soon as possible for a visit in 3 days.   Why:  Follow-up for AIDS   Contact information:   [address] [place] [address]       Follow up with [facility]    . Schedule an appointment as soon as possible for a visit in 1 week.   Contact information:   [address] [place] [address] [phone]       Consults obtained - ID  Diet and Activity recommendation: See Discharge Instructions below  Discharge Instructions       Discharge Instructions    Diet - low  sodium heart healthy    Complete by:  As directed      Discharge instructions    Complete by:  As directed   Follow with Primary MD in 7 days   Get CBC, CMP, 2 view Chest X ray checked  by Primary MD next visit.    Activity: As tolerated with Full fall precautions use walker/cane & assistance as needed   Disposition Home     Diet: Regular.  For Heart failure patients - Check your Weight same time everyday, if you gain over 2 pounds, or you develop in leg swelling, experience more shortness of breath or chest pain, call your Primary MD immediately. Follow Cardiac Low Salt Diet and 1.5 lit/day fluid restriction.   On your next visit with your primary care physician please Get Medicines reviewed and adjusted.   Please request your Prim.MD to go over  all Hospital Tests and Procedure/Radiological results at the follow up, please get all Hospital records sent to your Prim MD by signing hospital release before you go home.   If you experience worsening of your admission symptoms, develop shortness of breath, life threatening emergency, suicidal or homicidal thoughts you must seek medical attention immediately by calling 911 or calling your MD immediately  if symptoms less severe.  You Must read complete instructions/literature along with all the possible adverse reactions/side effects for all the Medicines you take and that have been prescribed to you. Take any new Medicines after you have completely understood and accpet all the possible adverse reactions/side effects.   Do not drive, operating heavy machinery, perform activities at heights, swimming or participation in water activities or provide baby sitting services if your were admitted for syncope or siezures until you have seen by Primary MD or a Neurologist and advised to do so again.  Do not drive when taking Pain medications.    Do not take more than prescribed Pain, Sleep and Anxiety Medications  Special Instructions: If you  have smoked or chewed Tobacco  in the last 2 yrs please stop smoking, stop any regular Alcohol  and or any Recreational drug use.  Wear Seat belts while driving.   Please note  You were cared for by a hospitalist during your hospital stay. If you have any questions about your discharge medications or the care you received while you were in the hospital after you are discharged, you can call the unit and asked to speak with the hospitalist on call if the hospitalist that took care of you is not available. Once you are discharged, your primary care physician will handle any further medical issues. Please note that NO REFILLS for any discharge medications will be authorized once you are discharged, as it is imperative that you return to your primary care physician (or establish a relationship with a primary care physician if you do not have one) for your aftercare needs so that they can reassess your need for medications and monitor your lab values.     Increase activity slowly    Complete by:  As directed              Discharge Medications       Medication List    TAKE these medications        azithromycin 600 MG tablet  Commonly known as:  ZITHROMAX  Take 2 tablets (1,200 mg total) by mouth every 7 (seven) days. Pt takes on Sundays     dapsone 100 MG tablet  Take 1 tablet (100 mg total) by mouth daily.     dolutegravir 50 MG tablet  Commonly known as:  TIVICAY  Take 1 tablet (50 mg total) by mouth daily.     dronabinol 5 MG capsule  Commonly known as:  MARINOL  Take 1 capsule (5 mg total) by mouth 2 (two) times daily before a meal.     emtricitabine-tenofovir AF 200-25 MG per tablet  Commonly known as:  DESCOVY  Take 1 tablet by mouth daily.     feeding supplement Liqd  Take 1 Container by mouth 3 (three) times daily between meals.     fluconazole 100 MG tablet  Commonly known as:  DIFLUCAN  Take 1 tablet (100 mg total) by mouth once a week. Pt takes on Sundays      loperamide 2 MG capsule  Commonly known as:  IMODIUM  Take 1 capsule (2  mg total) by mouth every 6 (six) hours as needed for diarrhea or loose stools.     midodrine 5 MG tablet  Commonly known as:  PROAMATINE  Take 1 tablet (5 mg total) by mouth 3 (three) times daily with meals.     nitazoxanide 500 MG tablet  Commonly known as:  ALINIA  Take 1 tablet (500 mg total) by mouth every 12 (twelve) hours.     sulfamethoxazole-trimethoprim 800-160 MG per tablet  Commonly known as:  BACTRIM DS,SEPTRA DS  Take 1 tablet by mouth 2 (two) times daily.        Major procedures and Radiology Reports - PLEASE review detailed and final reports for all details, in brief -       Ct Abdomen Pelvis Wo Contrast  12/08/2014   CLINICAL DATA:  Left lower quadrant and bilateral flank pain. Nausea. History of ulcerative colitis.  EXAM: CT ABDOMEN AND PELVIS WITHOUT CONTRAST  TECHNIQUE: Multidetector CT imaging of the abdomen and pelvis was performed following the standard protocol without IV contrast.  COMPARISON:  None.  FINDINGS: Linear scarring in the right lung base. Left base clear. Heart is normal size. No effusions.  Paucity of intraperitoneal fat and lack of intravenous contrast somewhat limits study. Liver, gallbladder, spleen, pancreas, adrenals and kidneys have an unremarkable unenhanced appearance. No hydronephrosis.  No evidence of bowel obstruction. Stomach, large and small bowel grossly unremarkable. No visible areas of bowel wall thickening. No visible free fluid, free air or adenopathy. Urinary bladder is unremarkable. No acute bony abnormality.  IMPRESSION: Study somewhat limited by lack of intraperitoneal fat and IV contrast. No visible acute findings on this nonenhanced CT.   Electronically Signed   By: Charlett Nose M.D.   On: 12/08/2014 12:35   Dg Chest 2 View  12/08/2014   CLINICAL DATA:  Increasingly worse cough today  EXAM: CHEST  2 VIEW  COMPARISON:  Multiple prior studies including  12/02/2014  FINDINGS: The heart size and mediastinal contours are within normal limits. Both lungs are clear. The visualized skeletal structures are unremarkable.  IMPRESSION: No active cardiopulmonary disease.   Electronically Signed   By: Esperanza Heir M.D.   On: 12/08/2014 15:48   Dg Abd Acute W/chest  12/02/2014   CLINICAL DATA:  29 year old male with diarrhea, nausea with increase p.o. intake and mid abdominal pain cramping for the past 7 days. History of ulcerative colitis.  EXAM: DG ABDOMEN ACUTE W/ 1V CHEST  COMPARISON:  Chest x-ray 01/09/2014.  FINDINGS: Mild scarring in the periphery of the right lung base, unchanged compared to the prior examination. Lung volumes are normal. No consolidative airspace disease. No pleural effusions. No pneumothorax. No pulmonary nodule or mass noted. Pulmonary vasculature and the cardiomediastinal silhouette are within normal limits.  Air-fluid levels are present in the colon. No pathologic dilatation of small bowel or colon. No pneumoperitoneum. Calcifications projecting over the upper abdomen are favored to be pancreatic. High density material in the right lower quadrant maybe postoperative, particularly if there is prior history of partial bowel resection.  IMPRESSION: 1. Nonspecific bowel gas pattern, as above. While findings do not indicate bowel obstruction, the presence of multiple air-fluid levels in the colon is compatible with the reported history of diarrhea, and could indicate active colitis. 2. No pneumoperitoneum. 3. No radiographic evidence of acute cardiopulmonary disease.   Electronically Signed   By: Trudie Reed M.D.   On: 12/02/2014 18:42    Micro Results      Recent  Results (from the past 240 hour(s))  C difficile quick scan w PCR reflex     Status: None   Collection Time: 12/22/14 11:24 PM  Result Value Ref Range Status   C Diff antigen NEGATIVE NEGATIVE Final   C Diff toxin NEGATIVE NEGATIVE Final   C Diff interpretation Negative  for toxigenic C. difficile  Final  Culture, blood (routine x 2)     Status: None (Preliminary result)   Collection Time: 12/27/14 12:15 PM  Result Value Ref Range Status   Specimen Description BLOOD LEFT ANTECUBITAL  Final   Special Requests BOTTLES DRAWN AEROBIC ONLY 2CC  Final   Culture NO GROWTH 4 DAYS  Final   Report Status PENDING  Incomplete  Culture, blood (routine x 2)     Status: None (Preliminary result)   Collection Time: 12/27/14 12:36 PM  Result Value Ref Range Status   Specimen Description BLOOD LEFT HAND  Final   Special Requests BOTTLES DRAWN AEROBIC ONLY 2CC  Final   Culture NO GROWTH 4 DAYS  Final   Report Status PENDING  Incomplete    Today   Subjective    Brian Adkins today has no headache,no chest abdominal pain,no new weakness tingling or numbness, feels much better wants to go home today.     Objective   Blood pressure 95/61, pulse 92, temperature 98.1 F (36.7 C), temperature source Oral, resp. rate 16, height 5\' 2"  (1.575 m), weight 43.545 kg (96 lb), SpO2 92 %.   Intake/Output Summary (Last 24 hours) at 01/01/15 1014 Last data filed at 12/31/14 1335  Gross per 24 hour  Intake    240 ml  Output      0 ml  Net    240 ml    Exam Awake Alert, Oriented x 3, No new F.N deficits, Normal affect, appears cachectic [place].AT,PERRAL Supple Neck,No JVD, No cervical lymphadenopathy appriciated.  Symmetrical Chest wall movement, Good air movement bilaterally, CTAB RRR,No Gallops,Rubs or new Murmurs, No Parasternal Heave +ve B.Sounds, Abd Soft, Non tender, No organomegaly appriciated, No rebound -guarding or rigidity. No Cyanosis, Clubbing or edema, No new Rash or bruise   Data Review   CBC w Diff: Lab Results  Component Value Date   WBC 2.1* 12/30/2014   HGB 11.6* 12/30/2014   HCT 33.0* 12/30/2014   PLT 231 12/30/2014   LYMPHOPCT 36 12/26/2014   BANDSPCT 0 11/28/2008   MONOPCT 10 12/26/2014   EOSPCT 1 12/26/2014   BASOPCT 0 12/26/2014    CMP: Lab  Results  Component Value Date   NA 137 01/01/2015   K 3.8 01/01/2015   CL 113* 01/01/2015   CO2 15* 01/01/2015   BUN 17 01/01/2015   CREATININE 0.79 01/01/2015   PROT 5.1* 12/26/2014   ALBUMIN 2.3* 12/26/2014   BILITOT 0.6 12/26/2014   ALKPHOS 57 12/26/2014   AST 60* 12/26/2014   ALT 25 12/26/2014  .   Total Time in preparing paper work, data evaluation and todays exam - 35 minutes  Leroy Sea M.D on 01/01/2015 at 10:14 AM  [facility]   Office  [phone]

## 2015-01-01 NOTE — Progress Notes (Signed)
Discharge instructions and medications discussed with patient.  Prescriptions, medication, and match letter given to patient.  Patient verbalized understanding that he will need to make follow-up appointments as per instruction on his discharge papers.  All questions answered.

## 2015-01-01 NOTE — Care Management (Addendum)
CM contacted [facility] inpatient pharmacy regarding the medication Alinia, CM picked up a 2 days dose and delivered to [facility] on [address]. CM met with patient to confirm discharge plan with [facility] RN services by [facility], and reminded patient to return to [facility] outpaitnt pharmacy on Monday with [place] letter to pick up the remainder of the [place]. Patient verbalized understanding and teach back done. Discussed the recommendation for [facility]patient [facility] rehab PT/OT, patient is agreeable. Faxed referral to [phone]. Patient also reminded to call for  follow up at [facility] as soon as possible. Patient verbalized understanding. No further CM needs identified. Patient being discharged home accompanied by family by private vehicle.

## 2015-01-03 LAB — HLA B*5701: HLA B 5701: NEGATIVE

## 2015-01-03 MED FILL — Nitazoxanide Tab 500 MG: ORAL | Qty: 1 | Status: AC

## 2015-01-19 ENCOUNTER — Ambulatory Visit: Payer: Self-pay | Admitting: Infectious Disease

## 2015-01-22 DEATH — deceased

## 2015-02-08 LAB — AFB CULTURE, BLOOD

## 2015-04-27 ENCOUNTER — Encounter: Payer: Self-pay | Admitting: *Deleted

## 2016-02-21 LAB — AFB CULTURE, BLOOD

## 2016-12-28 IMAGING — DX DG ABDOMEN ACUTE W/ 1V CHEST
3 series · 3 of 3 positions shown · non-contrast
Comparison: Chest x-ray 01/09/2014.

CLINICAL DATA: 29-year-old male with diarrhea, nausea with increase
p.o. intake and mid abdominal pain cramping for the past 7 days.
History of ulcerative colitis.

EXAM:
DG ABDOMEN ACUTE W/ 1V CHEST

[chest pa]
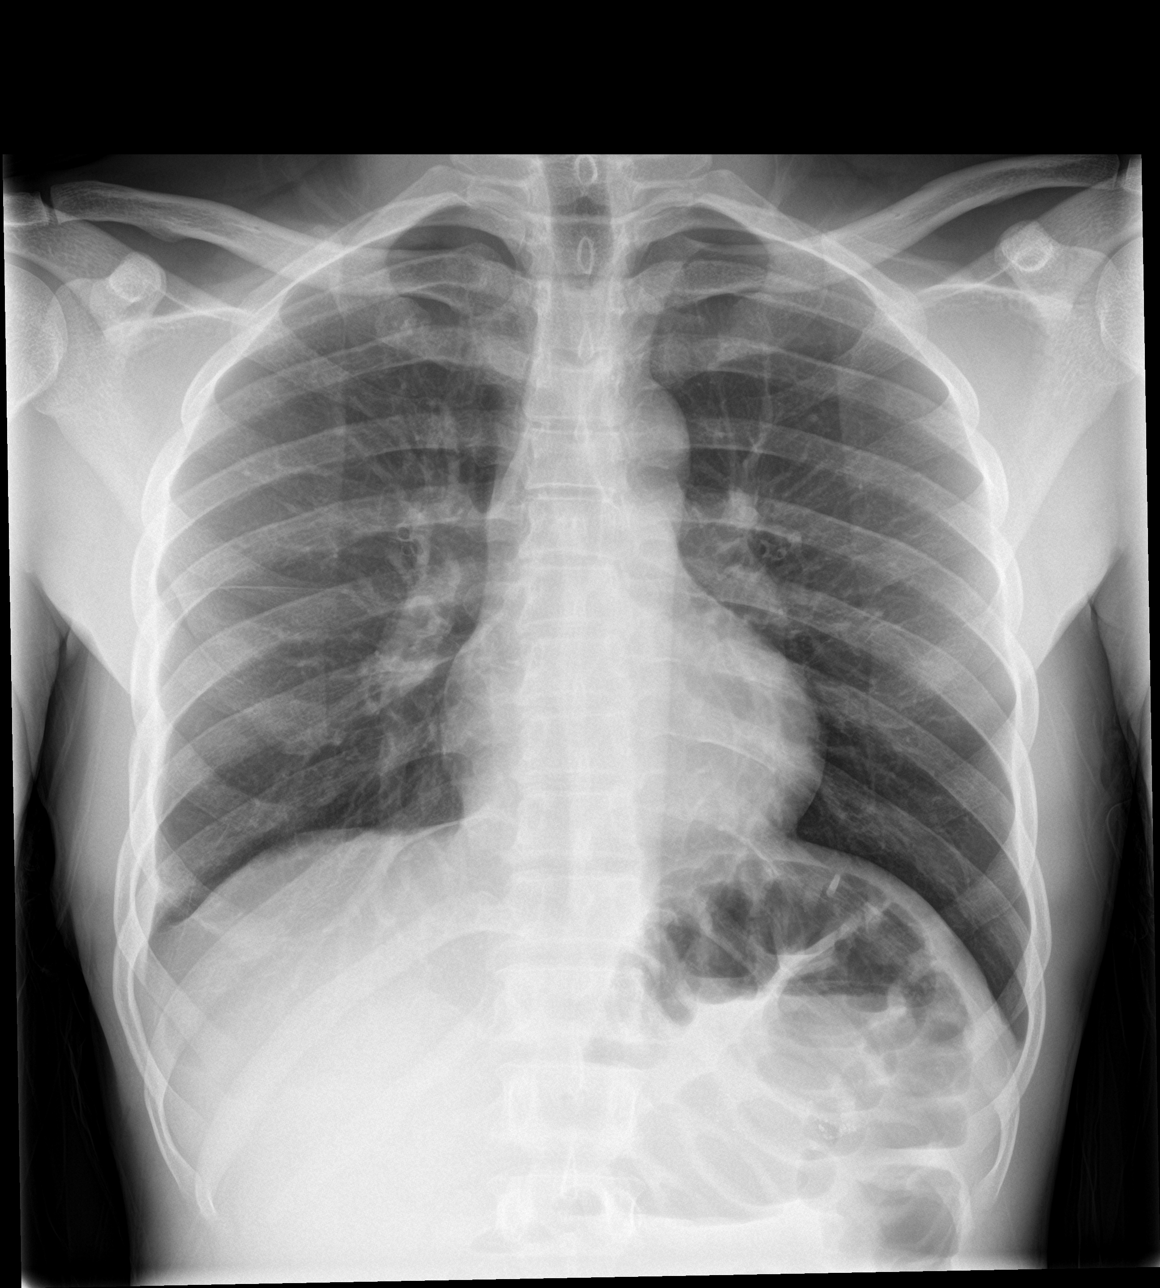

[abdomen erect]
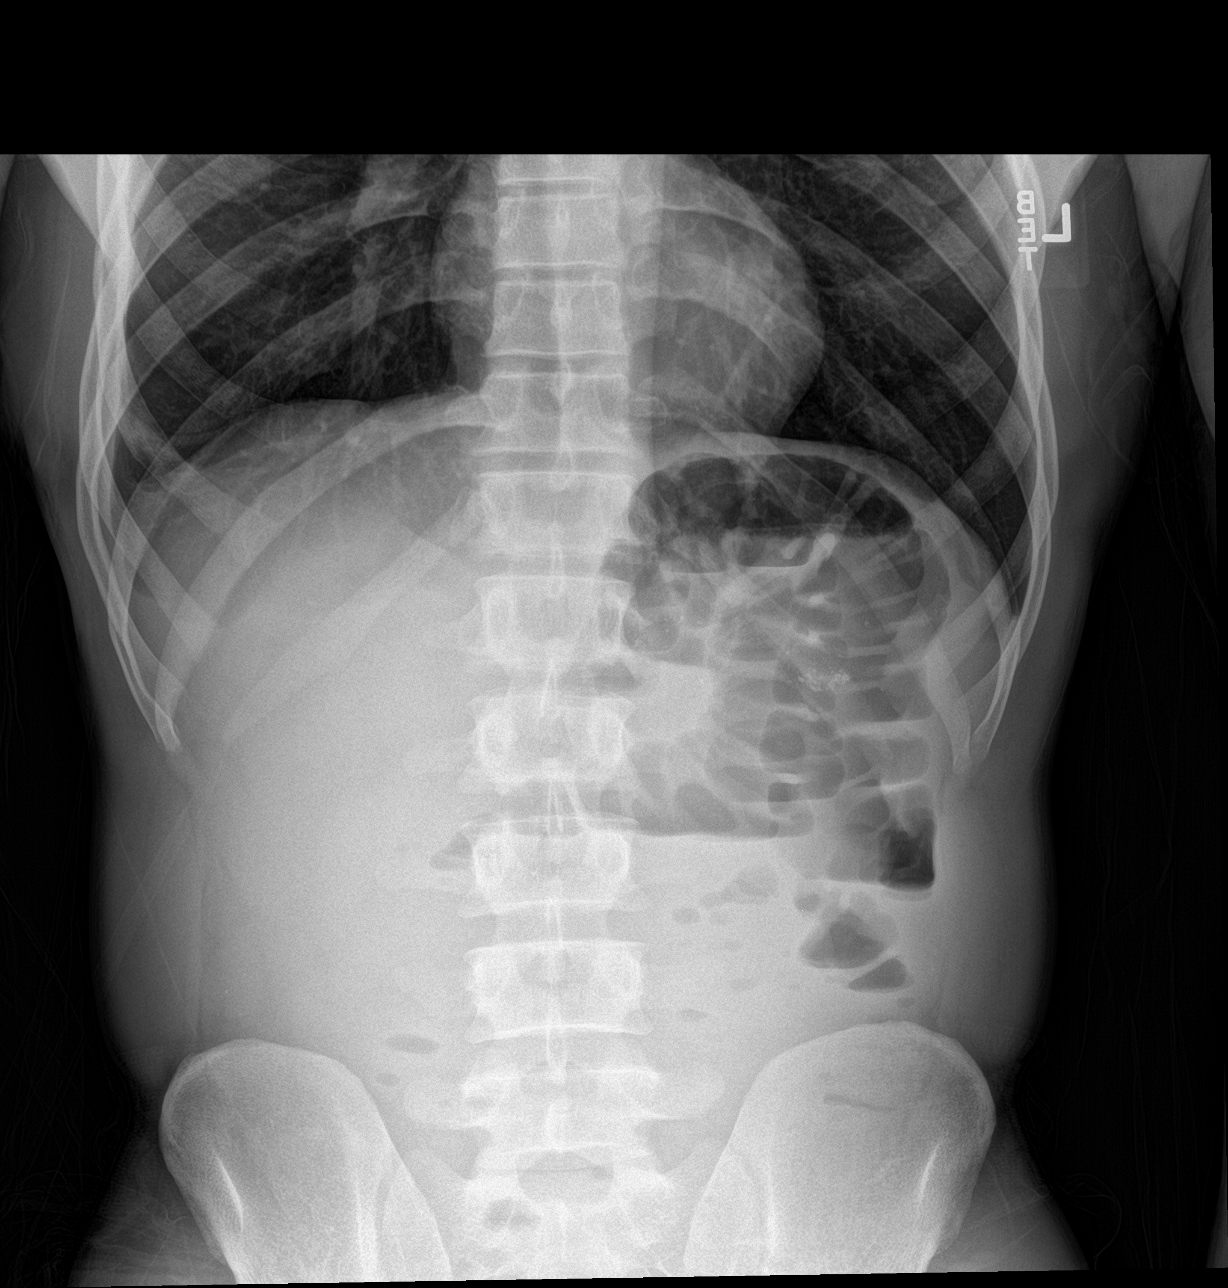

[abdomen supine]
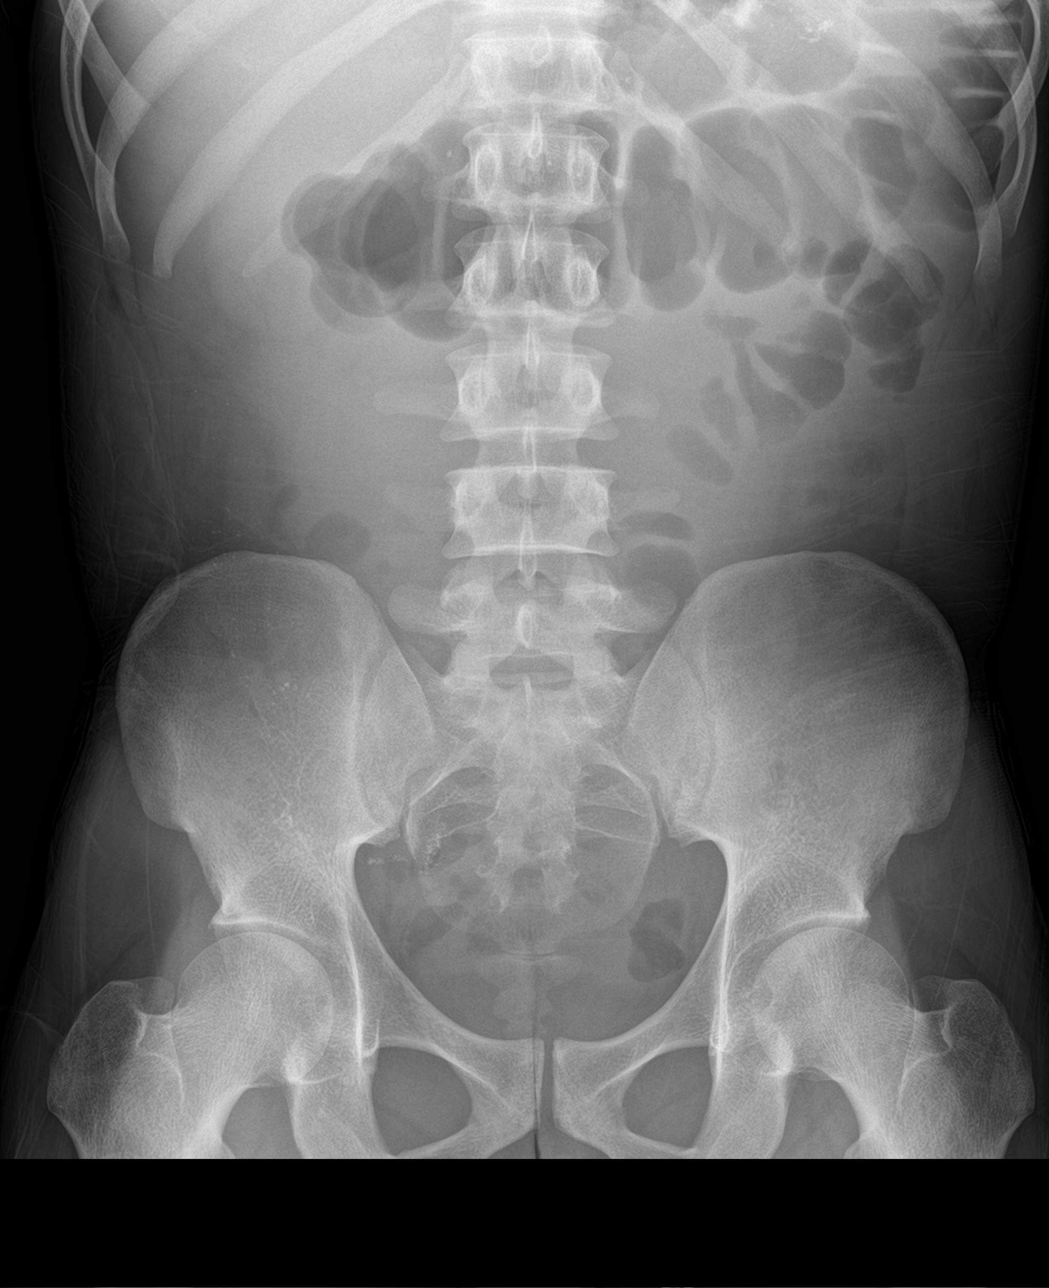

[3 of 3 positions shown; findings below may reference images not displayed]

FINDINGS: Mild scarring in the periphery of the right lung base, unchanged
compared to the prior examination. Lung volumes are normal. No
consolidative airspace disease. No pleural effusions. No
pneumothorax. No pulmonary nodule or mass noted. Pulmonary
vasculature and the cardiomediastinal silhouette are within normal
limits.

Air-fluid levels are present in the colon. No pathologic dilatation
of small bowel or colon. No pneumoperitoneum. Calcifications
projecting over the upper abdomen are favored to be pancreatic. High
density material in the right lower quadrant maybe postoperative,
particularly if there is prior history of partial bowel resection.
IMPRESSION: 1. Nonspecific bowel gas pattern, as above. While findings do not
indicate bowel obstruction, the presence of multiple air-fluid
levels in the colon is compatible with the reported history of
diarrhea, and could indicate active colitis.
2. No pneumoperitoneum.
3. No radiographic evidence of acute cardiopulmonary disease.

## 2017-01-03 IMAGING — DX DG CHEST 2V
2 series · 2 of 2 positions shown · non-contrast
Comparison: Multiple prior studies including 12/02/2014

CLINICAL DATA: Increasingly worse cough today

EXAM:
CHEST  2 VIEW

[chest pa]
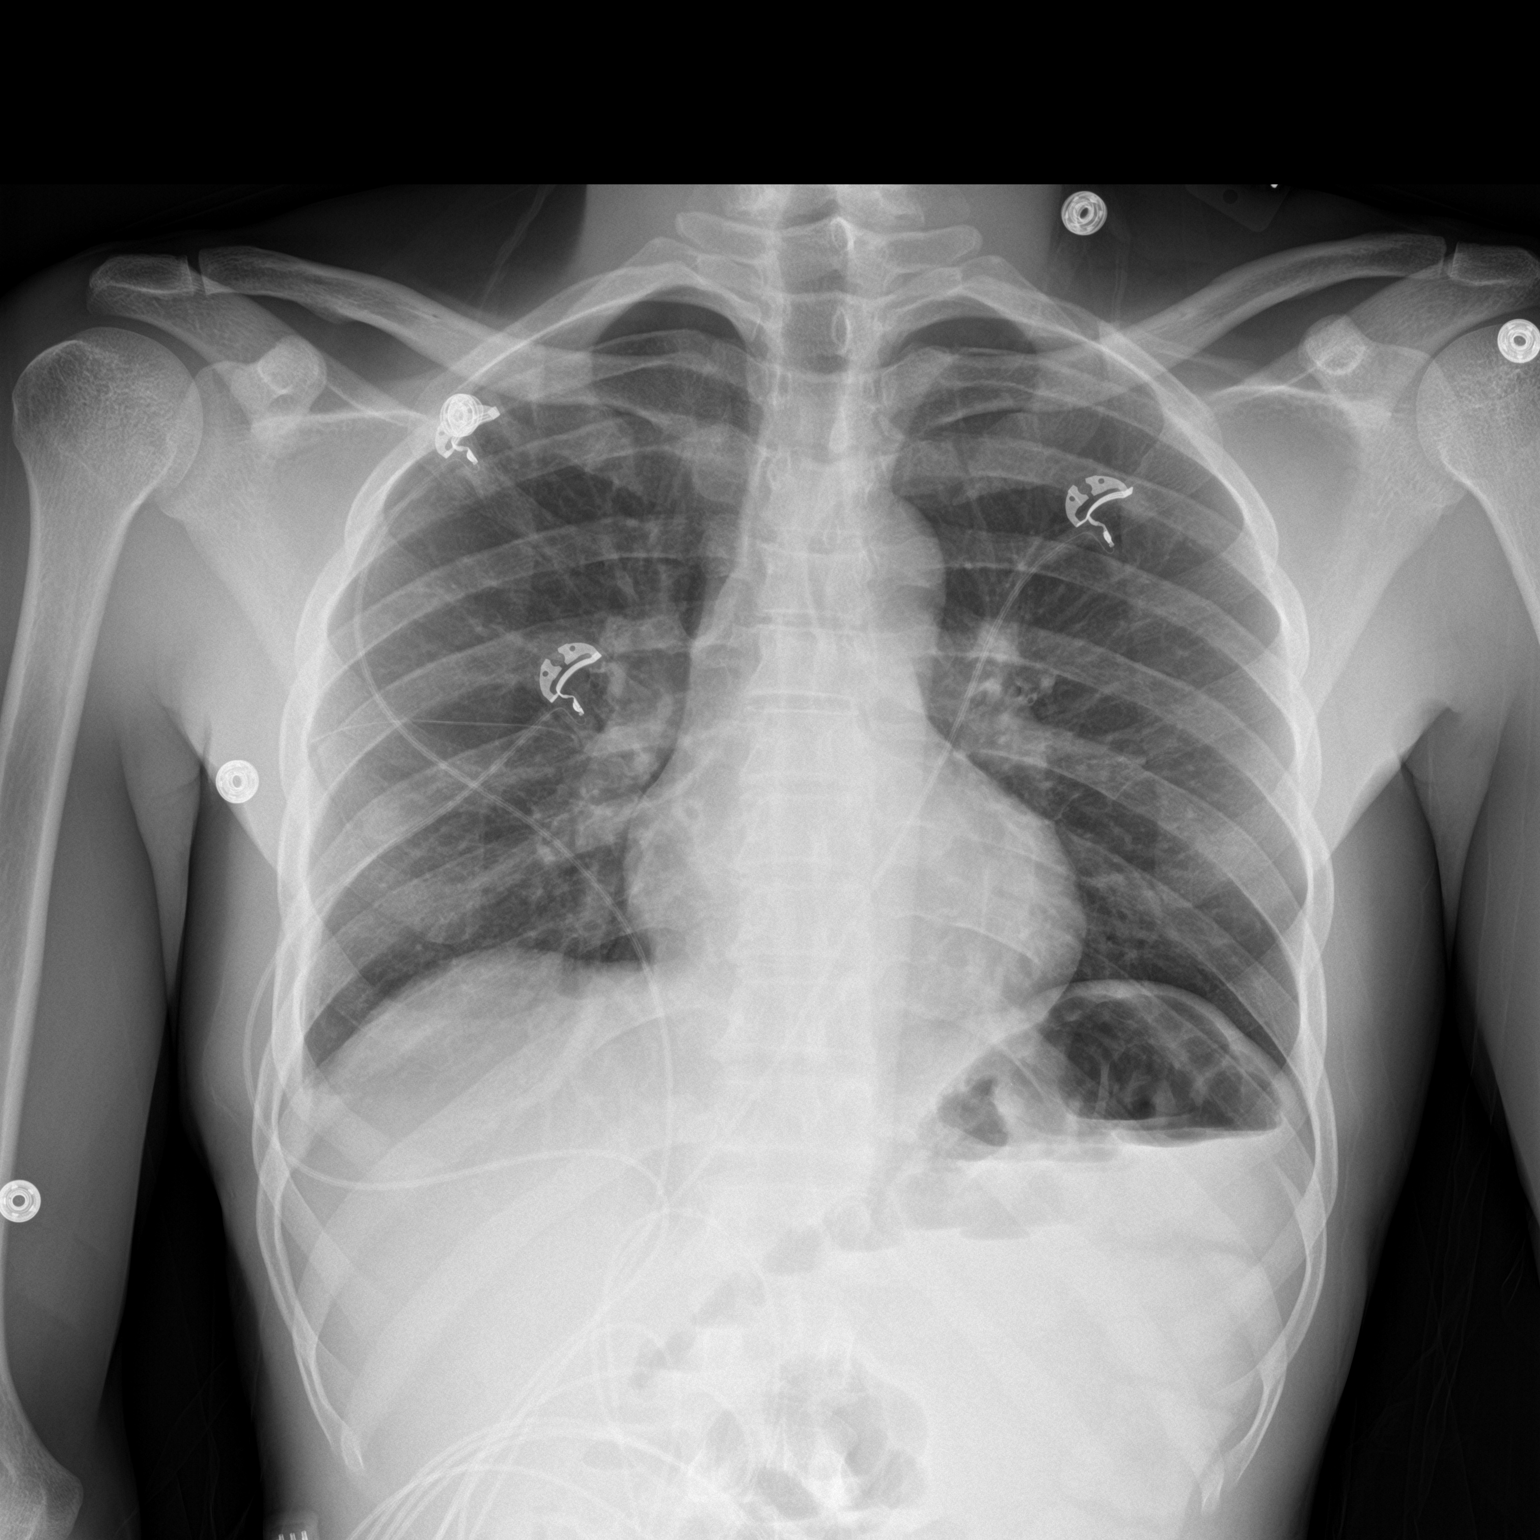

[chest lat]
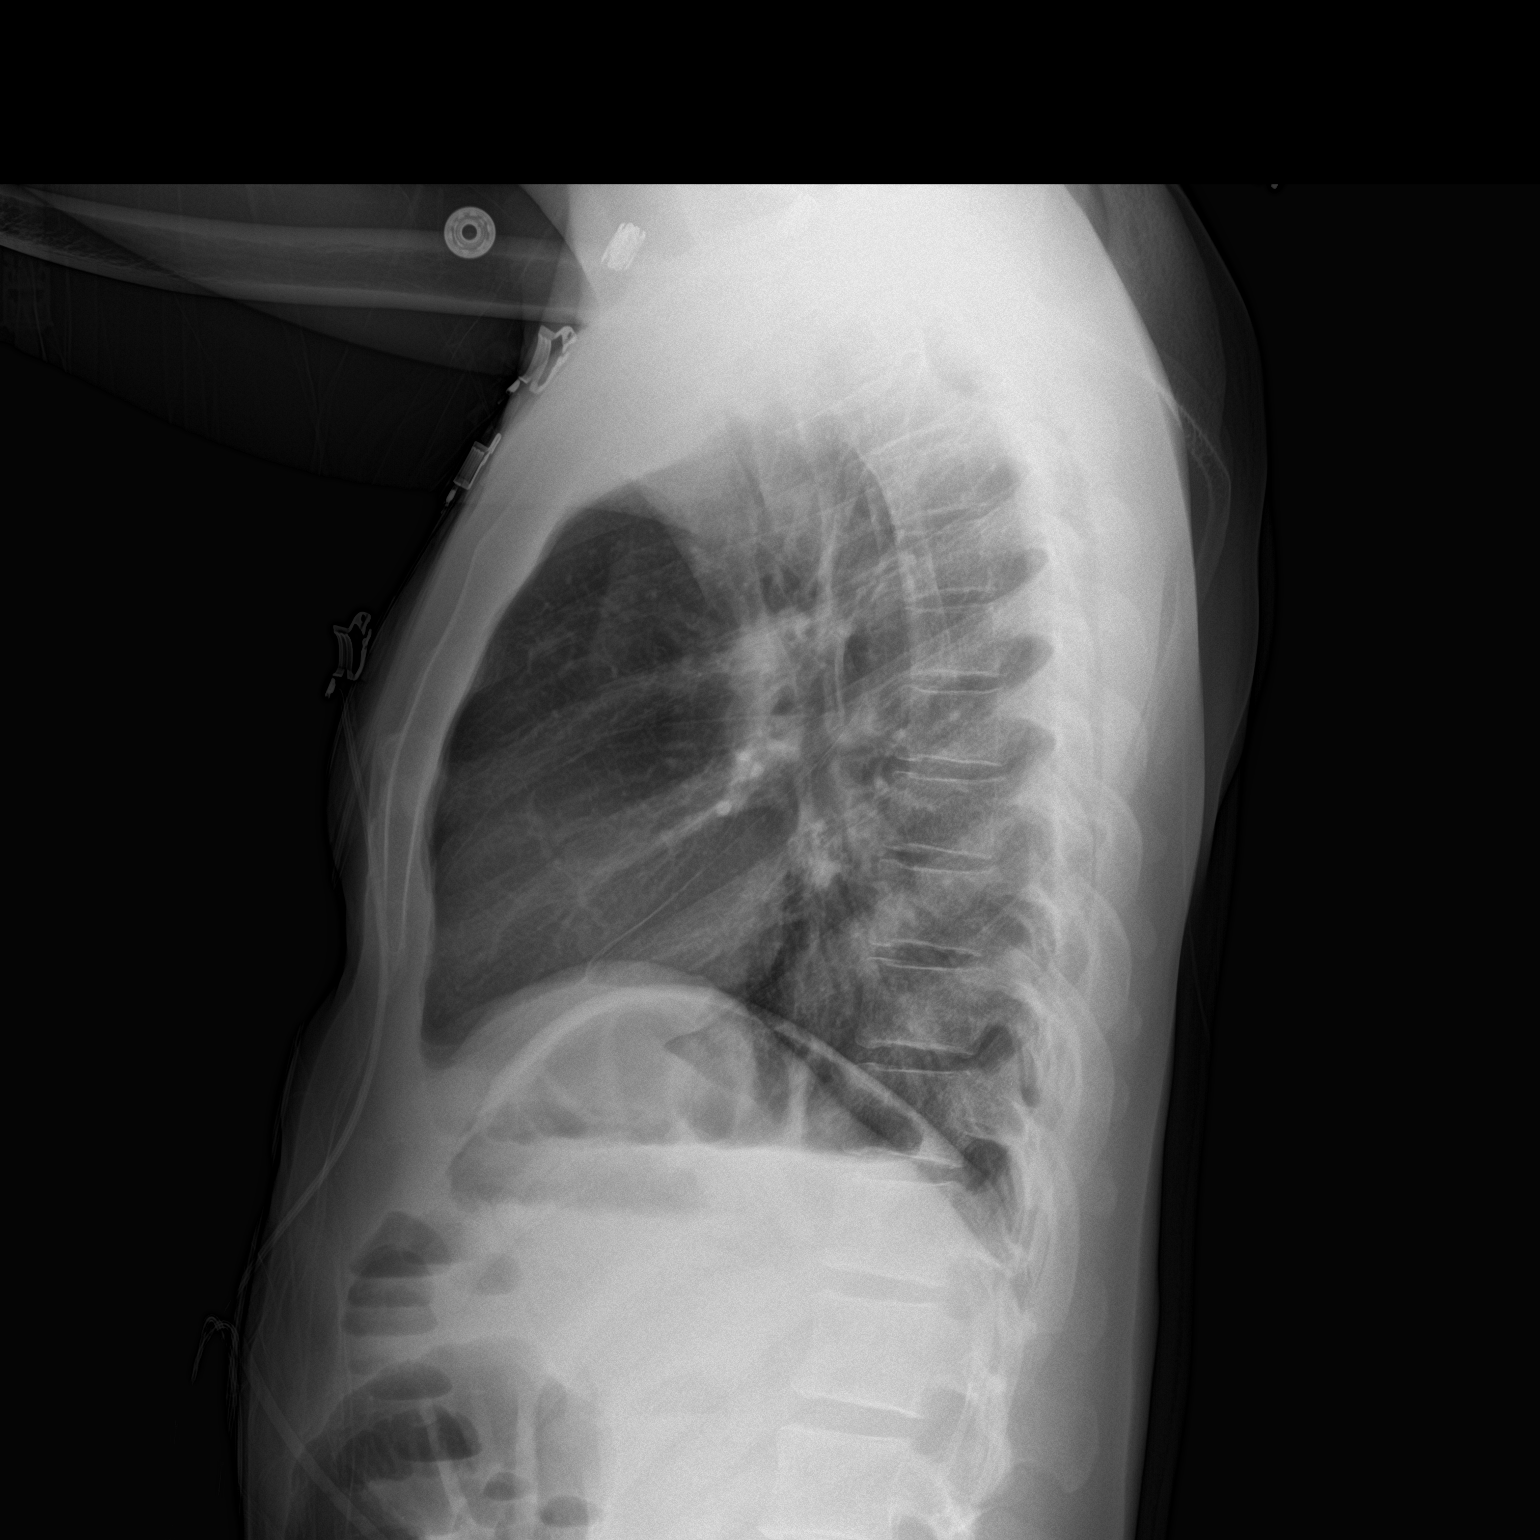

[2 of 2 positions shown; findings below may reference images not displayed]

FINDINGS: The heart size and mediastinal contours are within normal limits.
Both lungs are clear. The visualized skeletal structures are
unremarkable.
IMPRESSION: No active cardiopulmonary disease.
